# Patient Record
Sex: Male | Born: 1960 | Race: Black or African American | Hispanic: No | Marital: Married | Smoking: Former smoker
Health system: Southern US, Community
[De-identification: ages and names within clinical notes are randomized; demographics above are authoritative.]

## PROBLEM LIST (undated history)

## (undated) DIAGNOSIS — E7800 Pure hypercholesterolemia, unspecified: Secondary | ICD-10-CM

## (undated) DIAGNOSIS — R319 Hematuria, unspecified: Secondary | ICD-10-CM

## (undated) DIAGNOSIS — R7302 Impaired glucose tolerance (oral): Secondary | ICD-10-CM

## (undated) DIAGNOSIS — J329 Chronic sinusitis, unspecified: Secondary | ICD-10-CM

## (undated) DIAGNOSIS — I10 Essential (primary) hypertension: Secondary | ICD-10-CM

## (undated) DIAGNOSIS — F329 Major depressive disorder, single episode, unspecified: Secondary | ICD-10-CM

## (undated) HISTORY — DX: Hematuria, unspecified: R31.9

## (undated) HISTORY — DX: Impaired glucose tolerance (oral): R73.02

## (undated) HISTORY — DX: Major depressive disorder, single episode, unspecified: F32.9

## (undated) HISTORY — PX: OTHER SURGICAL HISTORY: SHX169

## (undated) HISTORY — DX: Chronic sinusitis, unspecified: J32.9

## (undated) HISTORY — DX: Essential (primary) hypertension: I10

## (undated) HISTORY — DX: Pure hypercholesterolemia, unspecified: E78.00

## (undated) HISTORY — PX: COLONOSCOPY: SHX174

---

## 2006-07-17 ENCOUNTER — Encounter: Admission: RE | Admit: 2006-07-17 | Discharge: 2006-07-17 | Payer: Self-pay | Admitting: Family Medicine

## 2010-08-10 ENCOUNTER — Other Ambulatory Visit: Payer: Self-pay | Admitting: Family Medicine

## 2010-08-10 ENCOUNTER — Ambulatory Visit
Admission: RE | Admit: 2010-08-10 | Discharge: 2010-08-10 | Disposition: A | Discharge status: Home / Self Care | Payer: 59 | Source: Ambulatory Visit | Attending: Family Medicine | Admitting: Family Medicine

## 2010-08-10 DIAGNOSIS — R1013 Epigastric pain: Secondary | ICD-10-CM

## 2010-08-10 DIAGNOSIS — D72829 Elevated white blood cell count, unspecified: Secondary | ICD-10-CM

## 2011-05-30 ENCOUNTER — Other Ambulatory Visit: Payer: Self-pay | Admitting: Physician Assistant

## 2011-05-30 DIAGNOSIS — Q619 Cystic kidney disease, unspecified: Secondary | ICD-10-CM

## 2011-06-07 ENCOUNTER — Ambulatory Visit
Admission: RE | Admit: 2011-06-07 | Discharge: 2011-06-07 | Disposition: A | Discharge status: Home / Self Care | Payer: 59 | Source: Ambulatory Visit | Attending: Physician Assistant | Admitting: Physician Assistant

## 2011-06-07 DIAGNOSIS — Q619 Cystic kidney disease, unspecified: Secondary | ICD-10-CM

## 2011-07-09 ENCOUNTER — Ambulatory Visit (INDEPENDENT_AMBULATORY_CARE_PROVIDER_SITE_OTHER): Payer: 59 | Admitting: Family Medicine

## 2011-07-09 VITALS — BP 135/81 | HR 90 | Temp 98.4°F | Resp 16 | Ht 65.5 in | Wt 185.8 lb

## 2011-07-09 DIAGNOSIS — E785 Hyperlipidemia, unspecified: Secondary | ICD-10-CM | POA: Insufficient documentation

## 2011-07-09 DIAGNOSIS — J309 Allergic rhinitis, unspecified: Secondary | ICD-10-CM

## 2011-07-09 DIAGNOSIS — I10 Essential (primary) hypertension: Secondary | ICD-10-CM

## 2011-07-09 DIAGNOSIS — J0100 Acute maxillary sinusitis, unspecified: Secondary | ICD-10-CM

## 2011-07-09 DIAGNOSIS — J329 Chronic sinusitis, unspecified: Secondary | ICD-10-CM

## 2011-07-09 MED ORDER — MOMETASONE FUROATE 50 MCG/ACT NA SUSP: 2.0000 | Freq: Every day | NASAL | Status: DC

## 2011-07-09 MED ORDER — AMOXICILLIN 875 MG PO TABS: 875.0000 mg | ORAL_TABLET | Freq: Two times a day (BID) | ORAL | Status: AC

## 2011-07-09 NOTE — Progress Notes (Signed)
  Subjective:    Patient ID: Isaac Mayer, male    DOB: 1961/03/14, 51 y.o.   MRN: [id number]  Sinusitis This is a new problem. The current episode started 1 to 4 weeks ago. The problem has been gradually worsening since onset. There has been no fever. Associated symptoms include congestion, a hoarse voice, sinus pressure and a sore throat. Past treatments include oral decongestants.   Using OTC tylenol sinus with short term relief of symptoms.  H/O recurrent sinusitis Allergic rhinitis HTN   Review of Systems  HENT: Positive for congestion, sore throat, hoarse voice and sinus pressure.        Objective:   Physical Exam  Constitutional: He appears well-developed.  HENT:  Right Ear: Tympanic membrane is retracted.  Nose: Mucosal edema (pale swollen turbinates (B)) present. Right sinus exhibits maxillary sinus tenderness.  Mouth/Throat: Posterior oropharyngeal erythema present.  Neck: Neck supple.  Cardiovascular: Normal rate, regular rhythm and normal heart sounds.   Pulmonary/Chest: Effort normal and breath sounds normal.  Lymphadenopathy:    He has cervical adenopathy.  Neurological: He is alert.  Skin: Skin is warm.          Assessment & Plan:   1. Sinusitis  amoxicillin (AMOXIL) 875 MG tablet  2. Allergic rhinitis due to allergen  mometasone (NASONEX) 50 MCG/ACT nasal spray  3. HTN (hypertension)    4. Dyslipidemia     Anticipatory guidance Call or RTC if symptoms persist or worsen

## 2013-12-02 ENCOUNTER — Emergency Department: Payer: Self-pay | Admitting: Emergency Medicine

## 2015-09-09 ENCOUNTER — Ambulatory Visit (INDEPENDENT_AMBULATORY_CARE_PROVIDER_SITE_OTHER): Payer: BLUE CROSS/BLUE SHIELD | Admitting: Physician Assistant

## 2015-09-09 VITALS — BP 138/78 | HR 96 | Temp 97.9°F | Resp 18 | Ht 66.0 in | Wt 198.8 lb

## 2015-09-09 DIAGNOSIS — J3089 Other allergic rhinitis: Secondary | ICD-10-CM | POA: Diagnosis not present

## 2015-09-09 DIAGNOSIS — R05 Cough: Secondary | ICD-10-CM | POA: Diagnosis not present

## 2015-09-09 DIAGNOSIS — R059 Cough, unspecified: Secondary | ICD-10-CM

## 2015-09-09 MED ORDER — BENZONATATE 100 MG PO CAPS: 100.0000 mg | ORAL_CAPSULE | Freq: Three times a day (TID) | ORAL | Status: DC | PRN

## 2015-09-09 MED ORDER — IPRATROPIUM BROMIDE 0.03 % NA SOLN: 2.0000 | Freq: Two times a day (BID) | NASAL | Status: DC

## 2015-09-09 MED ORDER — MOMETASONE FUROATE 50 MCG/ACT NA SUSP: 2.0000 | Freq: Every day | NASAL | Status: AC

## 2015-09-09 NOTE — Progress Notes (Signed)
Chief Complaint  Patient presents with  . Cough    chest congestion/ x 2wk/ chest hurts from coughing  . Sinusitis    x 3 days  . Sore Throat    scratchy/ x2days    History of Present Illness: Patient presents today with a 2 week history of sneezing, followed a few days later by congestion and nasal drainage, with a milky yellow discharge, and a dry hacking cough that began 4 days ago. He is accompanied by his wife, who "made" him come in for evaluation. They are scheduled to leave today for a trip to [place], [place].  Admits to difficulty sleeping since cough began, chest pain and shortness of breath associated only with coughing fits. Denies fever, sputum, nausea, vomiting, and diarrhea. Tried mucinex and nyquil to alievate symptoms with no relief.  Patient has history of seasonal allergies, usually with relief from over the counter allergy medications. He has Nasonex, but does not use it.  Review of Systems  Genitourinary: Negative for urgency, decreased urine volume and difficulty urinating.  Musculoskeletal: Negative for neck pain and neck stiffness.  Skin: Negative for color change.  Neurological: Negative for dizziness and headaches.   All other systems reviewed are negative except those listed in HPI.   No Known Allergies  Prior to Admission medications   Medication Sig Start Date End Date Taking? Authorizing Provider  amLODipine (NORVASC) 10 MG tablet Take 10 mg by mouth daily.   Yes Historical Provider, MD  atorvastatin (LIPITOR) 40 MG tablet Take 40 mg by mouth daily.   Yes Historical Provider, MD  FLUoxetine (PROZAC) 20 MG capsule Take 20 mg by mouth daily.   Yes Historical Provider, MD  mometasone (NASONEX) 50 MCG/ACT nasal spray Place 2 sprays into the nose daily. 07/09/11 07/08/12  Dois Davenport, MD    Patient Active Problem List   Diagnosis Date Noted  . HTN (hypertension) 07/09/2011  . Dyslipidemia 07/09/2011     Physical Exam  Constitutional: He is  oriented to person, place, and time. He appears well-developed and well-nourished. No distress.  BP 138/78 mmHg  Pulse 96  Temp(Src) 97.9 F (36.6 C) (Oral)  Resp 18  Ht  (1.676 m)  Wt 198 lb 12.8 oz (90.175 kg)  BMI 32.10 kg/m2  SpO2 98%   HENT:  Head: Normocephalic and atraumatic.  Right Ear: Hearing, tympanic membrane, external ear and ear canal normal.  Left Ear: Hearing, tympanic membrane, external ear and ear canal normal.  Nose: Mucosal edema and rhinorrhea present.  No foreign bodies. Right sinus exhibits no maxillary sinus tenderness and no frontal sinus tenderness. Left sinus exhibits no maxillary sinus tenderness and no frontal sinus tenderness.  Mouth/Throat: Uvula is midline, oropharynx is clear and moist and mucous membranes are normal. No uvula swelling. No oropharyngeal exudate.  Eyes: Conjunctivae and EOM are normal. Pupils are equal, round, and reactive to light. Right eye exhibits no discharge. Left eye exhibits no discharge. No scleral icterus.  Neck: Trachea normal, normal range of motion and full passive range of motion without pain. Neck supple. No thyroid mass and no thyromegaly present.  Cardiovascular: Normal rate, regular rhythm and normal heart sounds.   Pulmonary/Chest: Effort normal and breath sounds normal.  Lymphadenopathy:       Head (right side): No submandibular, no tonsillar, no preauricular, no posterior auricular and no occipital adenopathy present.       Head (left side): No submandibular, no tonsillar, no preauricular and no occipital adenopathy present.  He has no cervical adenopathy.       Right: No supraclavicular adenopathy present.       Left: No supraclavicular adenopathy present.  Neurological: He is alert and oriented to person, place, and time. He has normal strength. No cranial nerve deficit or sensory deficit.  Skin: Skin is warm, dry and intact. No rash noted.  Psychiatric: He has a normal mood and affect. His speech is normal  and behavior is normal.      ASSESSMENT & PLAN:  1. Cough Due to post-nasal drainage. See below. Supportive care. - benzonatate (TESSALON) 100 MG capsule; Take 1-2 capsules (100-200 mg total) by mouth 3 (three) times daily as needed for cough.  Dispense: 40 capsule; Refill: 0  2. Other allergic rhinitis Restart Nasonex. Add OTC oral antihistamine. Add Atrovent NS. Hydrate.  - ipratropium (ATROVENT) 0.03 % nasal spray; Place 2 sprays into both nostrils 2 (two) times daily.  Dispense: 30 mL; Refill: 0 - mometasone (NASONEX) 50 MCG/ACT nasal spray; Place 2 sprays into the nose daily.  Dispense: 1 g; Refill: 12   Fernande Brashelle S. Sameena Artus, PA-C Physician Assistant-Certified [facility]

## 2015-09-09 NOTE — Patient Instructions (Signed)
Get plenty of rest and drink at least 64 ounces of water daily.     IF you received an x-ray today, you will receive an invoice from [facility]. Please contact [facility] at [phone] with questions or concerns regarding your invoice.   IF you received labwork today, you will receive an invoice from [facility]. Please contact [facility] at [phone] with questions or concerns regarding your invoice.   Our billing staff will not be able to assist you with questions regarding bills from these companies.  You will be contacted with the lab results as soon as they are available. The fastest way to get your results is to activate your My Chart account. Instructions are located on the last page of this paperwork. If you have not heard from us regarding the results in 2 weeks, please contact this office.     

## 2015-09-09 NOTE — Progress Notes (Signed)
   Subjective:    Patient ID: Isaac Mayer, male    DOB: 1960-12-13, 55 y.o.   MRN: [id number]  Chief Complaint  Patient presents with  . Cough    chest congestion/ x 2wk/ chest hurts from coughing  . Sinusitis    x 3 days  . Sore Throat    scratchy/ x2days   HPI  Patient presents today with a 2 week history of sneezing, followed a few days later by congestion and nasal drainage, with a milky yellow discharge, and a dry hacking cough that began 4 days ago.  Admits to difficulty sleeping since cough began, chest pain and shortness of breath associated only with coughing fits.  Denies fever, sputum, nausea, vomiting, and diarrhea.  Tried mucinex and nyquil to alievate symptoms with no relief. Patient has history of seasonal allergies, usually with relief from over the counter allergy medications.  Review of Systems  Genitourinary: Negative for urgency, decreased urine volume and difficulty urinating.  Musculoskeletal: Negative for neck pain and neck stiffness.  Skin: Negative for color change.  Neurological: Negative for dizziness and headaches.   All other systems reviewed are negative except those listed in HPI.    Patient Active Problem List   Diagnosis Date Noted  . HTN (hypertension) 07/09/2011  . Dyslipidemia 07/09/2011   Current Outpatient Prescriptions on File Prior to Visit  Medication Sig Dispense Refill  . amLODipine (NORVASC) 10 MG tablet Take 10 mg by mouth daily.    Marland Kitchen. atorvastatin (LIPITOR) 40 MG tablet Take 40 mg by mouth daily.     No current facility-administered medications on file prior to visit.   No Known Allergies  Objective: BP 138/78 mmHg  Pulse 96  Temp(Src) 97.9 F (36.6 C) (Oral)  Resp 18  Ht 5\' 6"  (1.676 m)  Wt 198 lb 12.8 oz (90.175 kg)  BMI 32.10 kg/m2  SpO2 98%   Physical Exam  Constitutional: He appears well-developed and well-nourished.  HENT:  Head: Normocephalic.  Nose: Rhinorrhea present. Right sinus exhibits no maxillary sinus  tenderness and no frontal sinus tenderness. Left sinus exhibits no maxillary sinus tenderness and no frontal sinus tenderness.  Mouth/Throat: Posterior oropharyngeal erythema present.    Cardiovascular: Normal rate, regular rhythm, normal heart sounds and intact distal pulses.   Pulmonary/Chest: Effort normal and breath sounds normal. No accessory muscle usage. No respiratory distress. He has no wheezes. He has no rhonchi. He has no rales.  Skin: Skin is warm, dry and intact.       Assessment & Plan:  1. Cough - Discussed management including allergy control with over the counter antihistamines and nasal  - benzonatate (TESSALON) 100 MG capsule; Take 1-2 capsules (100-200 mg total) by mouth 3 (three) times daily as needed for cough.  Dispense: 40 capsule; Refill: 0  2. Other allergic rhinitis -Use of OTC antihistamines and combination of prescribed nasal spray should alleviate allergy symptoms.   - ipratropium (ATROVENT) 0.03 % nasal spray; Place 2 sprays into both nostrils 2 (two) times daily.  Dispense: 30 mL; Refill: 0 - mometasone (NASONEX) 50 MCG/ACT nasal spray; Place 2 sprays into the nose daily.  Dispense: 1 g; Refill: 12

## 2016-03-24 ENCOUNTER — Ambulatory Visit (INDEPENDENT_AMBULATORY_CARE_PROVIDER_SITE_OTHER): Payer: BLUE CROSS/BLUE SHIELD | Admitting: Neurology

## 2016-03-24 ENCOUNTER — Encounter: Payer: Self-pay | Admitting: Neurology

## 2016-03-24 VITALS — BP 132/72 | HR 77 | Resp 20 | Ht 66.0 in | Wt 200.0 lb

## 2016-03-24 DIAGNOSIS — R400 Somnolence: Secondary | ICD-10-CM

## 2016-03-24 DIAGNOSIS — R0681 Apnea, not elsewhere classified: Secondary | ICD-10-CM | POA: Diagnosis not present

## 2016-03-24 DIAGNOSIS — E669 Obesity, unspecified: Secondary | ICD-10-CM | POA: Diagnosis not present

## 2016-03-24 DIAGNOSIS — F418 Other specified anxiety disorders: Secondary | ICD-10-CM | POA: Diagnosis not present

## 2016-03-24 DIAGNOSIS — F518 Other sleep disorders not due to a substance or known physiological condition: Secondary | ICD-10-CM

## 2016-03-24 DIAGNOSIS — G4726 Circadian rhythm sleep disorder, shift work type: Secondary | ICD-10-CM

## 2016-03-24 DIAGNOSIS — R0683 Snoring: Secondary | ICD-10-CM

## 2016-03-24 NOTE — Patient Instructions (Signed)

## 2016-03-24 NOTE — Progress Notes (Signed)
Subjective:    Patient ID: Isaac Mayer is a 55 y.o. male.  HPI     Huston FoleySaima Deetta Siegmann, MD, PhD [facility] [address], [address] [address] [place], [place] [address]  HPI:   Dear Dr. Nicholos Johnsamachandran,   I saw your patient, Isaac Mayer, upon your kind request in my neurologic clinic today for initial consultation of his sleep disorder, in particular, concern for underlying obstructive sleep apnea. The patient is unaccompanied today. As you know, Mr. Isaac Mayer is a 55 year old right-handed gentleman with an underlying medical history of hyperlipidemia, hypertension, depression, impaired glucose tolerance, and obesity, who reports snoring and excessive daytime somnolence. I reviewed your office note from 02/15/2016, which you kindly included.  His main complaint is multiple night time awakenings, difficulty maintaining sleep for years. He snores, he has had pauses in his breathing, he has occasional morning headaches and has nocturia once per night, no clear RLS, but has jerking movements in sleep, waking him up or noticed by wife, no TV on at night.  He is a light sleeper, has knee pain, R > L.  In the past, he had a sleep study, but results are not available for my review. He was recently started on Doxepin 3 mg, but takes it only a couple of times a week, and breaks of a piece, felt drowsy on the whole pill. Had groogy feeling in the past with ambien IR, Lunesta, sonate and trazodone, Melatonin did not help, PM meds caused him to be not refreshed.  Had blood work, including TFTs, all benign.  No FHx of OSA. Has been on Vit D. Of note, for the past 3 years he has been working shifts. He has 12 hour shifts. He works from 5 AM to 5 PM. He works 3 days on and one day off, then 3 days on and 7 days off. On his work nights, he goes to bed between 7 and 8 and has to get up at 3:30 AM, on other nights he tries to go to bed at 10 PM. He lives with his wife, wife snores. He has 3 grown  sons, 3 grandchildren. He works as a Counsellorprinter. He quit smoking in the 90s, drinks alcohol occasionally, sometimes on weekends. He drinks 1-2 cups of coffee per day, occasional sodas. Epworth Sleepiness Scale score is 4 out of 24 today, fatigue score is 51 out of 63. He typically does not wake up rested and is sleepy during the day but usually does not fall asleep as long as he is moving.   His Past Medical History Is Significant For: Past Medical History:  Diagnosis Date  . Hematuria   . Hypercholesteremia   . Hypertension   . Impaired glucose tolerance   . MDD (major depressive disorder)   . Sinusitis     His Past Surgical History Is Significant For: Past Surgical History:  Procedure Laterality Date  . thumb surgery Left     His Family History Is Significant For: Family History  Problem Relation Age of Onset  . Diabetes Mother   . Hyperlipidemia Father   . Hypertension Father     His Social History Is Significant For: Social History   Social History  . Marital status: Married    Spouse name: N/A  . Number of children: N/A  . Years of education: N/A   Social History Main Topics  . Smoking status: Former Games developermoker  . Smokeless tobacco: None  . Alcohol use None  . Drug  use: Unknown  . Sexual activity: Not Asked   Other Topics Concern  . None   Social History Narrative  . None    His Allergies Are:  No Known Allergies:   His Current Medications Are:  Outpatient Encounter Prescriptions as of 03/24/2016  Medication Sig  . amLODipine (NORVASC) 10 MG tablet Take 10 mg by mouth daily.  Marland Kitchen atorvastatin (LIPITOR) 40 MG tablet Take 40 mg by mouth daily.  . Doxepin HCl (SILENOR) 3 MG TABS Take 3 mg by mouth at bedtime as needed.   Marland Kitchen FLUoxetine (PROZAC) 40 MG capsule TK ONE C PO  DAILY  . fluticasone (FLONASE) 50 MCG/ACT nasal spray Place into both nostrils daily.  Marland Kitchen ipratropium (ATROVENT) 0.03 % nasal spray Place 2 sprays into both nostrils 2 (two) times daily.  Marland Kitchen  lisinopril (PRINIVIL,ZESTRIL) 10 MG tablet TK 1 T PO QD  . mometasone (NASONEX) 50 MCG/ACT nasal spray Place 2 sprays into the nose daily.  . [DISCONTINUED] benzonatate (TESSALON) 100 MG capsule Take 1-2 capsules (100-200 mg total) by mouth 3 (three) times daily as needed for cough.   No facility-administered encounter medications on file as of 03/24/2016.   :  Review of Systems:  Out of a complete 14 point review of systems, all are reviewed and negative with the exception of these symptoms as listed below: Review of Systems  Neurological: Positive for headaches.       Pt presents today to discuss his sleep. Pt says that he does not snore at night. Pt had a sleep study many years ago that showed he did sleep. Pt takes doxepin 3mg  if needed for sleep.  Epworth Sleepiness Scale 0= would never doze 1= slight chance of dozing 2= moderate chance of dozing 3= high chance of dozing  Sitting and reading: 0 Watching TV: 1 Sitting inactive in a public place (ex. Theater or meeting): 0 As a passenger in a car for an hour without a break: 1 Lying down to rest in the afternoon: 1 Sitting and talking to someone: 0 Sitting quietly after lunch (no alcohol): 1 In a car, while stopped in traffic: 0 Total: 4   Psychiatric/Behavioral: Positive for sleep disturbance.    Objective:  Neurologic Exam  Physical Exam Physical Examination:   Vitals:   03/24/16 0900  BP: 132/72  Pulse: 77  Resp: 20    General Examination: The patient is a very pleasant 55 y.o. male in no acute distress. He appears well-developed and well-nourished and well groomed.   HEENT: Normocephalic, atraumatic, pupils are equal, round and reactive to light and accommodation. Funduscopic exam is normal with sharp disc margins noted. Extraocular tracking is good without limitation to gaze excursion or nystagmus noted. Normal smooth pursuit is noted. Hearing is grossly intact. Tympanic membranes are clear bilaterally. Face is  symmetric with normal facial animation and normal facial sensation. Speech is clear with no dysarthria noted. There is no hypophonia. There is no lip, neck/head, jaw or voice tremor. Neck is supple with full range of passive and active motion. There are no carotid bruits on auscultation. Oropharynx exam reveals: mild mouth dryness, adequate dental hygiene with few missing teeth and moderate airway crowding, due to smaller airway entry, thicker soft palate, and larger appearing uvula. Mallampati is class III. Tongue protrudes centrally and palate elevates symmetrically. Mouth opening small. Tonsils are absent. Neck size is 17 5/8 inches. He has a Mild overbite. Nasal inspection reveals no significant nasal mucosal bogginess or redness and no septal  deviation.   Chest: Clear to auscultation without wheezing, rhonchi or crackles noted.  Heart: S1+S2+0, regular and normal without murmurs, rubs or gallops noted.   Abdomen: Soft, non-tender and non-distended with normal bowel sounds appreciated on auscultation.  Extremities: There is no pitting edema in the distal lower extremities bilaterally. Pedal pulses are intact.  Skin: Warm and dry without trophic changes noted. There are no varicose veins.  Musculoskeletal: exam reveals no obvious joint deformities, tenderness or joint swelling or erythema.   Neurologically:  Mental status: The patient is awake, alert and oriented in all 4 spheres. His immediate and remote memory, attention, language skills and fund of knowledge are appropriate. There is no evidence of aphasia, agnosia, apraxia or anomia. Speech is clear with normal prosody and enunciation. Thought process is linear. Mood is normal and affect is normal.  Cranial nerves II - XII are as described above under HEENT exam. In addition: shoulder shrug is normal with equal shoulder height noted. Motor exam: Normal bulk, strength and tone is noted. There is no drift, tremor or rebound. Romberg is  negative. Reflexes are 2+ throughout. Fine motor skills and coordination: intact with normal finger taps, normal hand movements, normal rapid alternating patting, normal foot taps and normal foot agility.  Cerebellar testing: No dysmetria or intention tremor on finger to nose testing. Heel to shin is unremarkable bilaterally. There is no truncal or gait ataxia.  Sensory exam: intact to light touch, pinprick, vibration, temperature sense in the upper and lower extremities.  Gait, station and balance: He stands easily. No veering to one side is noted. No leaning to one side is noted. Posture is age-appropriate and stance is narrow based. Gait shows normal stride length and normal pace. No problems turning are noted. Tandem walk is unremarkable.  Assessment and Plan:  In summary, Isaac Mayer is a very pleasant 55 y.o.-year old male with an underlying medical history of hyperlipidemia, hypertension, depression, impaired glucose tolerance, and obesity, whose history and physical exam are concerning for obstructive sleep apnea (OSA). He has a borderline enlarged next size, crowded airway, and has obesity. In addition, he has had morning headaches. Furthermore, he reports sleep related movements including jerking movements, likely in keeping with sleep starts. He has been on antidepressant medication for anxiety and depression, while the medication has been working for him, he has had more anxiety when he does not sleep well. In addition, his shift work is likely a contributor to his sleep disturbance at this time as well. I had a long chat with the patient about my findings and the diagnosis of OSA, its prognosis and treatment options. We talked about medical treatments, surgical interventions and non-pharmacological approaches. I explained in particular the risks and ramifications of untreated moderate to severe OSA, especially with respect to developing cardiovascular disease down the Road, including  congestive heart failure, difficult to treat hypertension, cardiac arrhythmias, or stroke. Even type 2 diabetes has, in part, been linked to untreated OSA. Symptoms of untreated OSA include daytime sleepiness, memory problems, mood irritability and mood disorder such as depression and anxiety, lack of energy, as well as recurrent headaches, especially morning headaches. We talked about trying to maintain a healthy lifestyle in general, as well as the importance of weight control. I encouraged the patient to eat healthy, exercise daily and keep well hydrated, to keep a scheduled bedtime and wake time routine, to not skip any meals and eat healthy snacks in between meals. I advised the patient not to  drive when feeling sleepy. He is advised that he can bring his doxepin for his sleep study. I recommended the following at this time: sleep study with potential positive airway pressure titration. (We will score hypopneas at 3% and split the sleep study into diagnostic and treatment portion, if the estimated. 2 hour AHI is >15/h).   I explained the sleep test procedure to the patient and also outlined possible surgical and non-surgical treatment options of OSA, including the use of a custom-made dental device (which would require a referral to a specialist dentist or oral surgeon), upper airway surgical options, such as pillar implants, radiofrequency surgery, tongue base surgery, and UPPP (which would involve a referral to an ENT surgeon). Rarely, jaw surgery such as mandibular advancement may be considered.  I also explained the CPAP treatment option to the patient, who indicated that he would be willing to try CPAP if the need arises. I explained the importance of being compliant with PAP treatment, not only for insurance purposes but primarily to improve His symptoms, and for the patient's long term health benefit, including to reduce His cardiovascular risks. I answered all his questions today and the patient  was in agreement. I would like to see him back after the sleep study is completed and encouraged him to call with any interim questions, concerns, problems or updates.   Thank you very much for allowing me to participate in the care of this nice patient. If I can be of any further assistance to you please do not hesitate to call me at [phone].  Sincerely,   Huston FoleySaima Lastacia Solum, MD, PhD

## 2017-04-10 ENCOUNTER — Other Ambulatory Visit: Payer: Self-pay | Admitting: Internal Medicine

## 2017-04-10 DIAGNOSIS — R319 Hematuria, unspecified: Secondary | ICD-10-CM

## 2017-04-12 ENCOUNTER — Ambulatory Visit
Admission: RE | Admit: 2017-04-12 | Discharge: 2017-04-12 | Disposition: A | Discharge status: Home / Self Care | Payer: BLUE CROSS/BLUE SHIELD | Source: Ambulatory Visit | Attending: Internal Medicine | Admitting: Internal Medicine

## 2017-04-12 DIAGNOSIS — R319 Hematuria, unspecified: Secondary | ICD-10-CM

## 2017-08-07 ENCOUNTER — Telehealth: Payer: Self-pay

## 2017-08-07 ENCOUNTER — Ambulatory Visit: Payer: Self-pay | Admitting: Neurology

## 2017-08-07 NOTE — Telephone Encounter (Signed)
Pt did not show for their appt with Dr. Athar today.  

## 2017-08-08 ENCOUNTER — Encounter: Payer: Self-pay | Admitting: Neurology

## 2017-10-05 DIAGNOSIS — E785 Hyperlipidemia, unspecified: Secondary | ICD-10-CM | POA: Diagnosis not present

## 2017-10-05 DIAGNOSIS — R319 Hematuria, unspecified: Secondary | ICD-10-CM | POA: Diagnosis not present

## 2017-10-05 DIAGNOSIS — I10 Essential (primary) hypertension: Secondary | ICD-10-CM | POA: Diagnosis not present

## 2017-10-11 DIAGNOSIS — R7303 Prediabetes: Secondary | ICD-10-CM | POA: Diagnosis not present

## 2017-10-11 DIAGNOSIS — R319 Hematuria, unspecified: Secondary | ICD-10-CM | POA: Diagnosis not present

## 2017-10-11 DIAGNOSIS — I10 Essential (primary) hypertension: Secondary | ICD-10-CM | POA: Diagnosis not present

## 2017-10-11 DIAGNOSIS — E785 Hyperlipidemia, unspecified: Secondary | ICD-10-CM | POA: Diagnosis not present

## 2017-11-06 DIAGNOSIS — H1045 Other chronic allergic conjunctivitis: Secondary | ICD-10-CM | POA: Diagnosis not present

## 2018-01-19 DIAGNOSIS — S76012A Strain of muscle, fascia and tendon of left hip, initial encounter: Secondary | ICD-10-CM | POA: Diagnosis not present

## 2018-04-05 DIAGNOSIS — G5602 Carpal tunnel syndrome, left upper limb: Secondary | ICD-10-CM | POA: Diagnosis not present

## 2018-04-19 DIAGNOSIS — M5412 Radiculopathy, cervical region: Secondary | ICD-10-CM | POA: Diagnosis not present

## 2018-04-19 DIAGNOSIS — R202 Paresthesia of skin: Secondary | ICD-10-CM | POA: Diagnosis not present

## 2018-04-20 DIAGNOSIS — Z125 Encounter for screening for malignant neoplasm of prostate: Secondary | ICD-10-CM | POA: Diagnosis not present

## 2018-04-20 DIAGNOSIS — I10 Essential (primary) hypertension: Secondary | ICD-10-CM | POA: Diagnosis not present

## 2018-04-20 DIAGNOSIS — E785 Hyperlipidemia, unspecified: Secondary | ICD-10-CM | POA: Diagnosis not present

## 2018-04-20 DIAGNOSIS — R7303 Prediabetes: Secondary | ICD-10-CM | POA: Diagnosis not present

## 2018-04-24 DIAGNOSIS — M5412 Radiculopathy, cervical region: Secondary | ICD-10-CM | POA: Diagnosis not present

## 2018-04-25 DIAGNOSIS — Z0000 Encounter for general adult medical examination without abnormal findings: Secondary | ICD-10-CM | POA: Diagnosis not present

## 2018-04-25 DIAGNOSIS — R7303 Prediabetes: Secondary | ICD-10-CM | POA: Diagnosis not present

## 2018-04-25 DIAGNOSIS — I10 Essential (primary) hypertension: Secondary | ICD-10-CM | POA: Diagnosis not present

## 2018-04-25 DIAGNOSIS — E785 Hyperlipidemia, unspecified: Secondary | ICD-10-CM | POA: Diagnosis not present

## 2018-05-03 DIAGNOSIS — M5412 Radiculopathy, cervical region: Secondary | ICD-10-CM | POA: Diagnosis not present

## 2018-08-22 DIAGNOSIS — M25562 Pain in left knee: Secondary | ICD-10-CM | POA: Diagnosis not present

## 2018-08-22 DIAGNOSIS — M25462 Effusion, left knee: Secondary | ICD-10-CM | POA: Diagnosis not present

## 2018-09-06 ENCOUNTER — Other Ambulatory Visit: Payer: Self-pay | Admitting: Sports Medicine

## 2018-09-06 DIAGNOSIS — M25462 Effusion, left knee: Secondary | ICD-10-CM | POA: Diagnosis not present

## 2018-09-06 DIAGNOSIS — M2392 Unspecified internal derangement of left knee: Secondary | ICD-10-CM | POA: Diagnosis not present

## 2018-09-06 DIAGNOSIS — G8929 Other chronic pain: Secondary | ICD-10-CM

## 2018-09-06 DIAGNOSIS — M76892 Other specified enthesopathies of left lower limb, excluding foot: Secondary | ICD-10-CM | POA: Diagnosis not present

## 2018-09-06 DIAGNOSIS — M25562 Pain in left knee: Secondary | ICD-10-CM | POA: Diagnosis not present

## 2018-09-16 ENCOUNTER — Ambulatory Visit: Payer: Self-pay

## 2018-09-24 ENCOUNTER — Ambulatory Visit
Admission: RE | Admit: 2018-09-24 | Discharge: 2018-09-24 | Disposition: A | Discharge status: Home / Self Care | Payer: BC Managed Care – PPO | Source: Ambulatory Visit | Attending: Sports Medicine | Admitting: Sports Medicine

## 2018-09-24 ENCOUNTER — Other Ambulatory Visit: Payer: Self-pay

## 2018-09-24 DIAGNOSIS — M25562 Pain in left knee: Secondary | ICD-10-CM | POA: Insufficient documentation

## 2018-09-24 DIAGNOSIS — M76892 Other specified enthesopathies of left lower limb, excluding foot: Secondary | ICD-10-CM | POA: Insufficient documentation

## 2018-09-24 DIAGNOSIS — G8929 Other chronic pain: Secondary | ICD-10-CM | POA: Insufficient documentation

## 2018-09-24 DIAGNOSIS — M65872 Other synovitis and tenosynovitis, left ankle and foot: Secondary | ICD-10-CM | POA: Diagnosis not present

## 2018-09-24 DIAGNOSIS — M2392 Unspecified internal derangement of left knee: Secondary | ICD-10-CM | POA: Diagnosis not present

## 2018-09-24 DIAGNOSIS — M25462 Effusion, left knee: Secondary | ICD-10-CM

## 2018-09-24 DIAGNOSIS — M7122 Synovial cyst of popliteal space [Baker], left knee: Secondary | ICD-10-CM | POA: Diagnosis not present

## 2018-10-04 DIAGNOSIS — M659 Synovitis and tenosynovitis, unspecified: Secondary | ICD-10-CM | POA: Diagnosis not present

## 2018-10-04 DIAGNOSIS — M25562 Pain in left knee: Secondary | ICD-10-CM | POA: Diagnosis not present

## 2018-10-04 DIAGNOSIS — M25462 Effusion, left knee: Secondary | ICD-10-CM | POA: Diagnosis not present

## 2018-10-04 DIAGNOSIS — M1712 Unilateral primary osteoarthritis, left knee: Secondary | ICD-10-CM | POA: Diagnosis not present

## 2018-10-22 DIAGNOSIS — H40013 Open angle with borderline findings, low risk, bilateral: Secondary | ICD-10-CM | POA: Diagnosis not present

## 2018-11-15 DIAGNOSIS — H40013 Open angle with borderline findings, low risk, bilateral: Secondary | ICD-10-CM | POA: Diagnosis not present

## 2018-11-30 DIAGNOSIS — I10 Essential (primary) hypertension: Secondary | ICD-10-CM | POA: Diagnosis not present

## 2018-11-30 DIAGNOSIS — R7303 Prediabetes: Secondary | ICD-10-CM | POA: Diagnosis not present

## 2018-11-30 DIAGNOSIS — E785 Hyperlipidemia, unspecified: Secondary | ICD-10-CM | POA: Diagnosis not present

## 2018-12-13 DIAGNOSIS — Z23 Encounter for immunization: Secondary | ICD-10-CM | POA: Diagnosis not present

## 2018-12-13 DIAGNOSIS — F320 Major depressive disorder, single episode, mild: Secondary | ICD-10-CM | POA: Diagnosis not present

## 2018-12-13 DIAGNOSIS — E785 Hyperlipidemia, unspecified: Secondary | ICD-10-CM | POA: Diagnosis not present

## 2018-12-13 DIAGNOSIS — F5101 Primary insomnia: Secondary | ICD-10-CM | POA: Diagnosis not present

## 2018-12-13 DIAGNOSIS — I10 Essential (primary) hypertension: Secondary | ICD-10-CM | POA: Diagnosis not present

## 2019-01-17 ENCOUNTER — Other Ambulatory Visit: Payer: Self-pay

## 2019-01-17 ENCOUNTER — Encounter: Payer: Self-pay | Admitting: Neurology

## 2019-01-17 ENCOUNTER — Ambulatory Visit: Payer: BC Managed Care – PPO | Admitting: Neurology

## 2019-01-17 VITALS — BP 151/85 | HR 106 | Temp 97.6°F | Ht 66.0 in | Wt 200.0 lb

## 2019-01-17 DIAGNOSIS — G478 Other sleep disorders: Secondary | ICD-10-CM | POA: Diagnosis not present

## 2019-01-17 DIAGNOSIS — R0683 Snoring: Secondary | ICD-10-CM

## 2019-01-17 DIAGNOSIS — G4719 Other hypersomnia: Secondary | ICD-10-CM | POA: Diagnosis not present

## 2019-01-17 DIAGNOSIS — E669 Obesity, unspecified: Secondary | ICD-10-CM

## 2019-01-17 DIAGNOSIS — R351 Nocturia: Secondary | ICD-10-CM

## 2019-01-17 DIAGNOSIS — R519 Headache, unspecified: Secondary | ICD-10-CM

## 2019-01-17 NOTE — Patient Instructions (Signed)

## 2019-01-17 NOTE — Progress Notes (Signed)
Subjective:    Patient ID: Isaac Mayer is a 58 y.o. male.  HPI     Interim history:   Isaac Mayer is a 57 year old right-handed gentleman with an underlying medical history of hyperlipidemia, seasonal allergies, hematuria, hypertension, depression, impaired glucose tolerance, and obesity, who presents for re-evaluation of his obstructive sleep apnea concern. The patient is unaccompanied today.  Of note, he no showed for an appointment on 08/07/2017. I first met him on 03/24/2016 at the request of his primary care physician, at which time the patient reported snoring and daytime somnolence. I suggested we proceed with a sleep study. He did not pursue it at the time.  Today 01/17/2019: He reports that he does not wake up rested.  He is tired during the day.  He works 1 week on and one-week off, typically from 5 AM to 5 PM 6 days a week when he is on his week on.  Bedtime is around 730 or 8, rise time around 3:30 AM.  He has nocturia about once per average night, has woken up with occasional morning headaches.  He does dream, he is a restless sleeper however and has multiple nighttime awakenings.  His wife has commented that he sounds like he has labored breathing or heavy breathing.  He has woken himself up with his snoring.  He had a tonsillectomy as a child.  He spends his days off with his 23-year-old grandson, his oldest son's son.  He has 3 grown sons.  His weight has been fairly stable.  He does drink quite a bit of caffeine, at least 2 maybe 3 large cups per day on average.  The patient's allergies, current medications, family history, past medical history, past social history, past surgical history and problem list were reviewed and updated as appropriate.    Previously:   03/24/2016: 58 year old right-handed gentleman with an underlying medical history of hyperlipidemia, hypertension, depression, impaired glucose tolerance, and obesity, who reports snoring and excessive daytime  somnolence. I reviewed your office note from 02/15/2016, which you kindly included.  His main complaint is multiple night time awakenings, difficulty maintaining sleep for years. He snores, he has had pauses in his breathing, he has occasional morning headaches and has nocturia once per night, no clear RLS, but has jerking movements in sleep, waking him up or noticed by wife, no TV on at night.  He is a light sleeper, has knee pain, R > L.  In the past, he had a sleep study, but results are not available for my review. He was recently started on Doxepin 3 mg, but takes it only a couple of times a week, and breaks of a piece, felt drowsy on the whole pill. Had groogy feeling in the past with ambien IR, Lunesta, sonate and trazodone, Melatonin did not help, PM meds caused him to be not refreshed.  Had blood work, including TFTs, all benign.  No FHx of OSA. Has been on Vit D. Of note, for the past 3 years he has been working shifts. He has 12 hour shifts. He works from 5 AM to 5 PM. He works 3 days on and one day off, then 3 days on and 7 days off. On his work nights, he goes to bed between 7 and 8 and has to get up at 3:30 AM, on other nights he tries to go to bed at 10 PM. He lives with his wife, wife snores. He has 3 grown sons, 3 grandchildren. He works as a  printer. He quit smoking in the 90s, drinks alcohol occasionally, sometimes on weekends. He drinks 1-2 cups of coffee per day, occasional sodas. Epworth Sleepiness Scale score is 4 out of 24 today, fatigue score is 51 out of 63. He typically does not wake up rested and is sleepy during the day but usually does not fall asleep as long as he is moving.   His Past Medical History Is Significant For: Past Medical History:  Diagnosis Date  . Hematuria   . Hypercholesteremia   . Hypertension   . Impaired glucose tolerance   . MDD (major depressive disorder)   . Sinusitis     His Past Surgical History Is Significant For: Past Surgical History:   Procedure Laterality Date  . thumb surgery Left     His Family History Is Significant For: Family History  Problem Relation Age of Onset  . Diabetes Mother   . Hyperlipidemia Father   . Hypertension Father     His Social History Is Significant For: Social History   Socioeconomic History  . Marital status: Married    Spouse name: Not on file  . Number of children: Not on file  . Years of education: Not on file  . Highest education level: Not on file  Occupational History  . Not on file  Social Needs  . Financial resource strain: Not on file  . Food insecurity    Worry: Not on file    Inability: Not on file  . Transportation needs    Medical: Not on file    Non-medical: Not on file  Tobacco Use  . Smoking status: Former Research scientist (life sciences)  . Smokeless tobacco: Never Used  Substance and Sexual Activity  . Alcohol use: Not on file  . Drug use: Not on file  . Sexual activity: Not on file  Lifestyle  . Physical activity    Days per week: Not on file    Minutes per session: Not on file  . Stress: Not on file  Relationships  . Social Herbalist on phone: Not on file    Gets together: Not on file    Attends religious service: Not on file    Active member of club or organization: Not on file    Attends meetings of clubs or organizations: Not on file    Relationship status: Not on file  Other Topics Concern  . Not on file  Social History Narrative  . Not on file    His Allergies Are:  No Known Allergies:   His Current Medications Are:  Outpatient Encounter Medications as of 01/17/2019  Medication Sig  . amLODipine (NORVASC) 10 MG tablet Take 10 mg by mouth daily.  Marland Kitchen atorvastatin (LIPITOR) 40 MG tablet Take 40 mg by mouth daily.  Marland Kitchen FLUoxetine (PROZAC) 40 MG capsule TK ONE C PO  DAILY  . lisinopril (PRINIVIL,ZESTRIL) 10 MG tablet TK 1 T PO QD  . meloxicam (MOBIC) 15 MG tablet Take 15 mg by mouth daily.  . [EXPIRED] mometasone (NASONEX) 50 MCG/ACT nasal spray  Place 2 sprays into the nose daily.  . [DISCONTINUED] Doxepin HCl (SILENOR) 3 MG TABS Take 3 mg by mouth at bedtime as needed.   . [DISCONTINUED] fluticasone (FLONASE) 50 MCG/ACT nasal spray Place into both nostrils daily.  . [DISCONTINUED] ipratropium (ATROVENT) 0.03 % nasal spray Place 2 sprays into both nostrils 2 (two) times daily.   No facility-administered encounter medications on file as of 01/17/2019.   :  Review  of Systems:  Out of a complete 14 point review of systems, all are reviewed and negative with the exception of these symptoms as listed below: Review of Systems  Neurological:       Pt presents today to discuss his sleep. Pt has had sleep studies in the past, the last one was around 2005, but never started on a cpap. Pt does endorse snoring.  Epworth Sleepiness Scale 0= would never doze 1= slight chance of dozing 2= moderate chance of dozing 3= high chance of dozing  Sitting and reading: 1 Watching TV: 2 Sitting inactive in a public place (ex. Theater or meeting): 0 As a passenger in a car for an hour without a break: 2 Lying down to rest in the afternoon: 1 Sitting and talking to someone: 0 Sitting quietly after lunch (no alcohol): 2 In a car, while stopped in traffic: 0 Total: 8     Objective:  Neurological Exam  Physical Exam Physical Examination:   Vitals:   01/17/19 0835  BP: (!) 151/85  Pulse: (!) 106  Temp: 97.6 F (36.4 C)   General Examination: The patient is a very pleasant 58 y.o. male in no acute distress. He appears well-developed and well-nourished and well groomed.   HEENT: Normocephalic, atraumatic, pupils are equal, round and reactive to light. He has corrective eyeglasses in place, extraocular tracking is well preserved.  Hearing is grossly intact.  Neck is supple, no carotid bruits.  Airway examination reveals several missing teeth, mild to moderate airway crowding, particularly due to small airway entry and wider appearing uvula,  Mallampati is class III.  Tonsils are absent.  Neck circumference is 17-1/2 inches. Tongue protrudes centrally and palate elevates symmetrically. Mouth opening small. He has a Mild overbite. Nasal inspection reveals no significant nasal mucosal bogginess or redness and no septal deviation.   Chest: Clear to auscultation without wheezing, rhonchi or crackles noted.  Heart: S1+S2+0, regular and normal without murmurs, rubs or gallops noted.   Abdomen: Soft, non-tender and non-distended with normal bowel sounds appreciated on auscultation.  Extremities: There is no pitting edema in the distal lower extremities bilaterally.   Skin: Warm and dry without trophic changes noted.  Musculoskeletal: exam reveals no obvious joint deformities, tenderness or joint swelling or erythema.   Neurologically:  Mental status: The patient is awake, alert and oriented in all 4 spheres. His immediate and remote memory, attention, language skills and fund of knowledge are appropriate. There is no evidence of aphasia, agnosia, apraxia or anomia. Speech is clear with normal prosody and enunciation. Thought process is linear. Mood is normal and affect is normal.  Cranial nerves II - XII are as described above under HEENT exam.  Motor exam: Normal bulk, strength and tone is noted. There is no tremor. Fine motor skills and coordination: grossly intact.  Cerebellar testing: No dysmetria or intention tremor. There is no truncal or gait ataxia.  Sensory exam: intact to light touch.  Gait, station and balance: He stands easily. No veering to one side is noted. No leaning to one side is noted. Posture is age-appropriate and stance is narrow based. Gait shows normal stride length and normal pace. No problems turning are noted.  Assessment and Plan:  In summary, Isaac Mayer is a very pleasant 58 year old male with an underlying medical history of hyperlipidemia, hypertension, depression, impaired glucose tolerance, and  obesity, who Presents for reevaluation of his sleep disorder, with concern for underlying obstructive sleep apnea.  I explained The  diagnosis and potential treatment options to him.  We talked about the risks and ramifications of untreated moderate to severe OSA, especially with respect to developing cardiovascular disease down the Road, including congestive heart failure, difficult to treat hypertension, cardiac arrhythmias, or stroke. Even type 2 diabetes has, in part, been linked to untreated OSA. Symptoms of untreated OSA include daytime sleepiness, memory problems, mood irritability and mood disorder such as depression and anxiety, lack of energy, as well as recurrent headaches, especially morning headaches. We talked about trying to maintain a healthy lifestyle in general, as well as the importance of weight control. I encouraged the patient to Scale back on his caffeine intake.  He reported that he had a large cup of coffee and nothing to eat this morning, blood pressure was a little elevated and pulse rate a little high. He had no symptoms from this. I recommended the following at this time: sleep study. I explained the sleep test procedure to the patient and also outlined possible surgical and non-surgical treatment options of OSA. I explained in particular the CPAP treatment option to the patient, who indicated that he would be willing to try CPAP if the need arises. I plan to see him back after testing.  I answered all his questions today and he was in agreement.   Star Age, MD, PhD

## 2019-02-11 ENCOUNTER — Ambulatory Visit (INDEPENDENT_AMBULATORY_CARE_PROVIDER_SITE_OTHER): Payer: BC Managed Care – PPO | Admitting: Neurology

## 2019-02-11 DIAGNOSIS — R0683 Snoring: Secondary | ICD-10-CM

## 2019-02-11 DIAGNOSIS — E669 Obesity, unspecified: Secondary | ICD-10-CM

## 2019-02-11 DIAGNOSIS — L600 Ingrowing nail: Secondary | ICD-10-CM | POA: Diagnosis not present

## 2019-02-11 DIAGNOSIS — G4720 Circadian rhythm sleep disorder, unspecified type: Secondary | ICD-10-CM

## 2019-02-11 DIAGNOSIS — R519 Headache, unspecified: Secondary | ICD-10-CM

## 2019-02-11 DIAGNOSIS — R351 Nocturia: Secondary | ICD-10-CM

## 2019-02-11 DIAGNOSIS — G4733 Obstructive sleep apnea (adult) (pediatric): Secondary | ICD-10-CM

## 2019-02-11 DIAGNOSIS — M79674 Pain in right toe(s): Secondary | ICD-10-CM | POA: Diagnosis not present

## 2019-02-11 DIAGNOSIS — B351 Tinea unguium: Secondary | ICD-10-CM | POA: Diagnosis not present

## 2019-02-11 DIAGNOSIS — M79675 Pain in left toe(s): Secondary | ICD-10-CM | POA: Diagnosis not present

## 2019-02-11 DIAGNOSIS — G4761 Periodic limb movement disorder: Secondary | ICD-10-CM

## 2019-02-11 DIAGNOSIS — G478 Other sleep disorders: Secondary | ICD-10-CM

## 2019-02-11 DIAGNOSIS — G4719 Other hypersomnia: Secondary | ICD-10-CM

## 2019-02-21 ENCOUNTER — Telehealth: Payer: Self-pay

## 2019-02-21 NOTE — Progress Notes (Signed)
Patient referred by Dr. Ashby Dawes, seen by me on 01/17/19, diagnostic PSG on 02/11/19.    Please call and notify the patient that the recent sleep study did confirm the diagnosis of obstructive sleep apnea. OSA is overall mild, but more severe in REM sleep. It is worth treating to see if he feels better after treatment. To that end I recommend treatment for this in the form of autoPAP, which means, that we don't have to bring him back for a second sleep study with CPAP, but will let him try an autoPAP machine at home, through a DME company (of his choice, or as per insurance requirement). The DME representative will educate him on how to use the machine, how to put the mask on, etc. I have placed an order in the chart. Please send referral, talk to patient, send report to referring MD. We will need a FU in sleep clinic for 10 weeks post-PAP set up, please arrange that with me or one of our NPs. Thanks,   Star Age, MD, PhD [facility])

## 2019-02-21 NOTE — Addendum Note (Signed)
Addended by: Star Age on: 02/21/2019 03:39 PM   Modules accepted: Orders

## 2019-02-21 NOTE — Progress Notes (Signed)
cpap

## 2019-02-21 NOTE — Procedures (Signed)
PATIENT'S NAME:  Isaac Mayer, Isaac Mayer DOB:      27-Jul-1960      MRN:    [id number]     DATE OF RECORDING: 02/11/2019 REFERRING M.D.:  Georgianne Fick, MD Study Performed:   Baseline Polysomnogram HISTORY: 58 year old man with a history of hyperlipidemia, seasonal allergies, hematuria, hypertension, depression, impaired glucose tolerance, and obesity, who reports snoring and daytime somnolence. The patient endorsed the Epworth Sleepiness Scale at 8/24 points. The patient's weight 200 pounds with a height of 66 (inches), resulting in a BMI of 32.2 kg/m2. The patient's neck circumference measured 17.5 inches.  CURRENT MEDICATIONS: Norvasc, Lipitor, Prozac, Prinivil, Mobic.   PROCEDURE:  This is a multichannel digital polysomnogram utilizing the Somnostar 11.2 system.  Electrodes and sensors were applied and monitored per AASM Specifications.   EEG, EOG, Chin and Limb EMG, were sampled at 200 Hz.  ECG, Snore and Nasal Pressure, Thermal Airflow, Respiratory Effort, CPAP Flow and Pressure, Oximetry was sampled at 50 Hz. Digital video and audio were recorded.      BASELINE STUDY: Lights Out was at 22:19 and Lights On at 04:59.  Total recording time (TRT) was 400.5 minutes, with a total sleep time (TST) of 328 minutes.   The patient's sleep latency was 27.5 minutes.  REM latency was 103.5 minutes.  The sleep efficiency was 81.9 %.     SLEEP ARCHITECTURE: WASO (Wake after sleep onset) was 47 minutes with mild sleep fragmentation noted.  There were 11.5 minutes in Stage N1, 208 minutes Stage N2, 63 minutes Stage N3 and 45.5 minutes in Stage REM.  The percentage of Stage N1 was 3.5%, Stage N2 was 63.4%, which is increased, Stage N3 was 19.2% and Stage R (REM sleep) was 13.9%, which is reduced.  RESPIRATORY ANALYSIS:  There were a total of 76 respiratory events:  1 obstructive apneas, 11 central apneas and 0 mixed apneas with a total of 12 apneas and an apnea index (AI) of 2.2 /hour. There were 64 hypopneas  with a hypopnea index of 11.7 /hour. The patient also had 0 respiratory event related arousals (RERAs).      The total APNEA/HYPOPNEA INDEX (AHI) was 13.9 /hour and the total RESPIRATORY DISTURBANCE INDEX was 13.9 /hour.  33 events occurred in REM sleep and 77 events in NREM. The REM AHI was 43.5 /hour, versus a non-REM AHI of 9.1. The patient spent 276.5 minutes of total sleep time in the supine position and 52 minutes in non-supine.. The supine AHI was 15.2/hour versus a non-supine AHI of 7.0/h.  OXYGEN SATURATION & C02:  The Wake baseline 02 saturation was 96%, with the lowest being 86%. Time spent below 89% saturation equaled 2 minutes.  AROUSALS:  The arousals were noted as: 70 were spontaneous, 18 were associated with PLMs, 13 were associated with respiratory events.  The patient had a total of 142 Periodic Limb Movements.  The Periodic Limb Movement (PLM) index was 26. and the PLM Arousal index was 3.3/hour. Audio and video analysis did not show any abnormal or unusual movements, complex behaviors, phonations or vocalizations. He had no nocturia. Mild snoring was noted. The EKG in normal sinus rhythm (NSR).  IMPRESSION:  1. Obstructive Sleep Apnea (OSA) 2. Periodic limb movements of sleep 3. Dysfunctions associated with sleep stages or arousals from sleep   RECOMMENDATIONS:  1. This study demonstrates overall mild obstructive sleep apnea, severe in REM sleep with a total AHI of 13.9/hour, REM AHI of 43.5/hour, supine AHI of 15.2/hour and O2 nadir  of 86%. Given the patient's medical history and sleep related complaints, treatment with positive airway pressure is recommended; this can be achieved in the form of autoPAP. Alternatively, a full-night CPAP titration study would allow optimization of therapy if needed. Other treatment options may include avoidance of supine sleep position along with weight loss, upper airway or jaw surgery in selected patients or the use of an oral appliance in  certain patients. ENT evaluation and/or consultation with a maxillofacial surgeon or dentist may be feasible in some instances.    2. Please note that untreated obstructive sleep apnea may carry additional perioperative morbidity. Patients with significant obstructive sleep apnea should receive perioperative PAP therapy and the surgeons and particularly the anesthesiologist should be informed of the diagnosis and the severity of the sleep disordered breathing. 3. Moderate PLMs (periodic limb movements of sleep) were noted during this study with no significant arousals; clinical correlation is recommended. Medication effect from the antidepressant medication should be considered. PLMs may improve with OSA treatment.  4. The patient should be cautioned not to drive, work at heights, or operate dangerous or heavy equipment when tired or sleepy. Review and reiteration of good sleep hygiene measures should be pursued with any patient. 5. The patient will be seen in follow-up by Dr. Rexene Alberts at [facility] for discussion of the test results and further management strategies. The referring provider will be notified of the test results.  I certify that I have reviewed the entire raw data recording prior to the issuance of this report in accordance with the Standards of Accreditation of the American Academy of Sleep Medicine (AASM)    Star Age, MD, PhD Diplomat, American Board of Neurology and Sleep Medicine (Neurology and Sleep Medicine)

## 2019-02-21 NOTE — Telephone Encounter (Signed)
I called pt to discuss his sleep study results. No answer, left a message asking him to call me back. 

## 2019-02-21 NOTE — Telephone Encounter (Signed)
-----   Message from Star Age, MD sent at 02/21/2019  3:39 PM EST ----- Patient referred by Dr. Ashby Dawes, seen by me on 01/17/19, diagnostic PSG on 02/11/19.    Please call and notify the patient that the recent sleep study did confirm the diagnosis of obstructive sleep apnea. OSA is overall mild, but more severe in REM sleep. It is worth treating to see if he feels better after treatment. To that end I recommend treatment for this in the form of autoPAP, which means, that we don't have to bring him back for a second sleep study with CPAP, but will let him try an autoPAP machine at home, through a DME company (of his choice, or as per insurance requirement). The DME representative will educate him on how to use the machine, how to put the mask on, etc. I have placed an order in the chart. Please send referral, talk to patient, send report to referring MD. We will need a FU in sleep clinic for 10 weeks post-PAP set up, please arrange that with me or one of our NPs. Thanks,   Star Age, MD, PhD [facility])

## 2019-02-26 NOTE — Telephone Encounter (Signed)
I called pt again to discuss his sleep study results. No answer, left a message asking him to call me back. 

## 2019-03-05 NOTE — Telephone Encounter (Signed)
I called pt again to discuss his sleep study results. No answer, left a message asking him to call me back. This is my third unsuccessful attempt at reaching pt by phone. I will send pt a letter asking him to call me back.

## 2019-03-06 DIAGNOSIS — G4733 Obstructive sleep apnea (adult) (pediatric): Secondary | ICD-10-CM | POA: Diagnosis not present

## 2019-03-06 DIAGNOSIS — F320 Major depressive disorder, single episode, mild: Secondary | ICD-10-CM | POA: Diagnosis not present

## 2019-03-06 DIAGNOSIS — I10 Essential (primary) hypertension: Secondary | ICD-10-CM | POA: Diagnosis not present

## 2019-03-06 DIAGNOSIS — F5101 Primary insomnia: Secondary | ICD-10-CM | POA: Diagnosis not present

## 2019-03-06 NOTE — Telephone Encounter (Signed)
Pt presented to the office to discuss his sleep study results. I advised pt that Dr. Rexene Alberts reviewed their sleep study results and found that pt has mild osa overall but severe in REM sleep. Dr. Rexene Alberts recommends that pt start an auto pap at home. I reviewed PAP compliance expectations with the pt. Pt is agreeable to starting an auto-PAP. I advised pt that an order will be sent to a DME, [facility], and [facility] will call the pt within about one week after they file with the pt's insurance. [facility] will show the pt how to use the machine, fit for masks, and troubleshoot the auto-PAP if needed. A follow up appt was made for insurance purposes with Amy, NP on 05/29/19 at 7:30am. Pt verbalized understanding to arrive 15 minutes early and bring their auto-PAP. A letter with all of this information in it will be mailed to the pt as a reminder. I verified with the pt that the address we have on file is correct. Pt verbalized understanding of results. Pt had no questions at this time but was encouraged to call back if questions arise. I have sent the order to [facility] and have received confirmation that they have received the order.

## 2019-03-15 DIAGNOSIS — Z20828 Contact with and (suspected) exposure to other viral communicable diseases: Secondary | ICD-10-CM | POA: Diagnosis not present

## 2019-03-21 DIAGNOSIS — G4733 Obstructive sleep apnea (adult) (pediatric): Secondary | ICD-10-CM | POA: Diagnosis not present

## 2019-05-29 ENCOUNTER — Ambulatory Visit: Payer: Self-pay | Admitting: Family Medicine

## 2019-12-14 IMAGING — MR MRI OF THE LEFT KNEE WITHOUT CONTRAST
7 series · 40 of 40 positions shown · non-contrast
Comparison: None.

CLINICAL DATA: Six week history of knee pain after doing yard work.

EXAM:
MRI OF THE LEFT KNEE WITHOUT CONTRAST
TECHNIQUE: Multiplanar, multisequence MR imaging of the knee was performed. No
intravenous contrast was administered.

[Series 12: T2 fat-sat · axial · left · 4.0mm · 0.50mm/px · z∈[-158,-33]mm · 7 of 26 slices shown (1 of 3)]
[im 1/26]
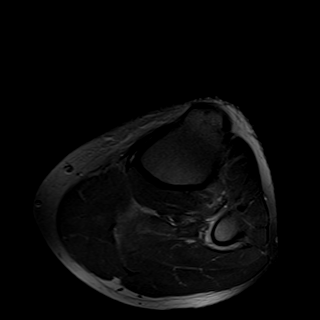
[im 5/26]
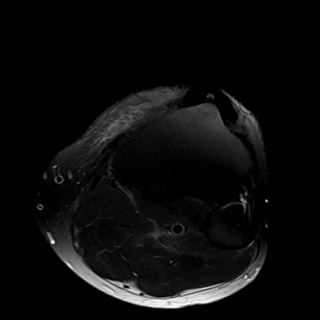
[im 9/26]
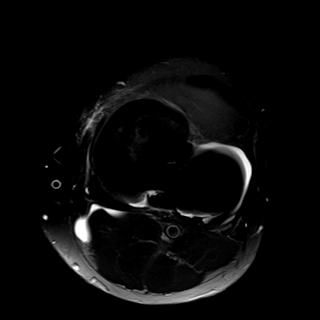
[im 13/26]
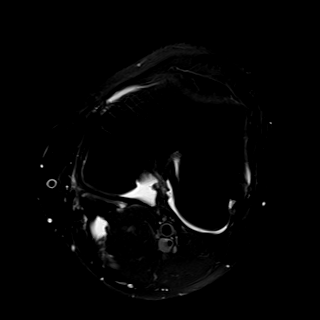
[im 17/26]
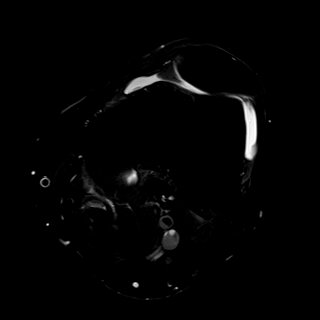
[im 21/26]
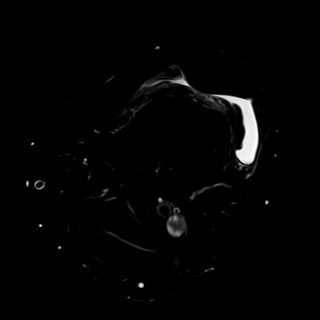
[im 26/26]
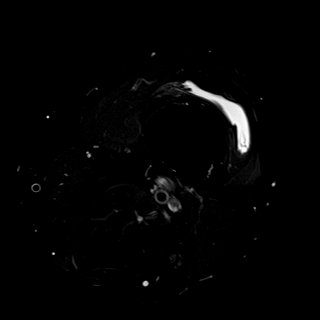

[Series 13: T1 · coronal · left · 4.0mm · 0.59mm/px · 6 of 30 slices shown]
[im 1/30]
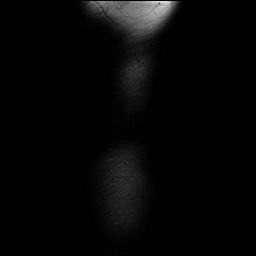
[im 6/30]
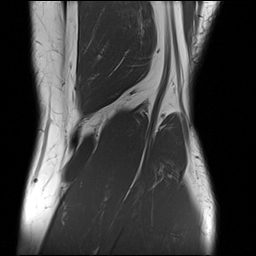
[im 12/30]
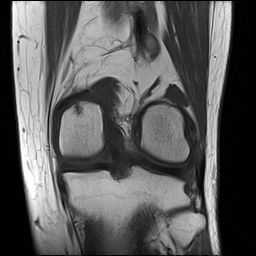
[im 18/30]
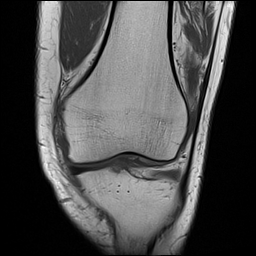
[im 24/30]
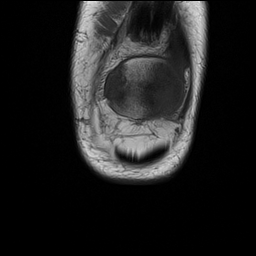
[im 30/30]
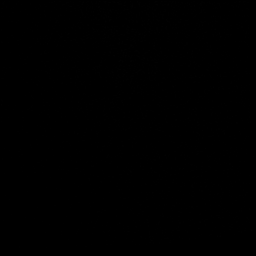

[Series 14: T2 fat-sat · coronal · left · 4.0mm · 0.59mm/px · 6 of 30 slices shown (2 of 3)]
[im 1/30]
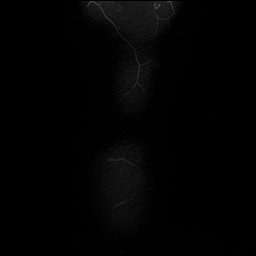
[im 6/30]
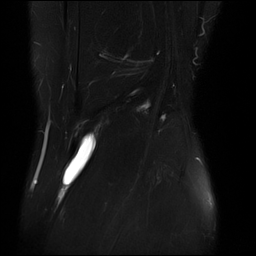
[im 12/30]
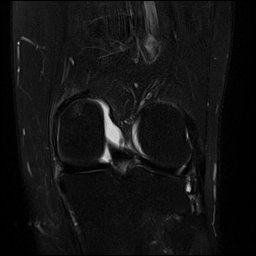
[im 18/30]
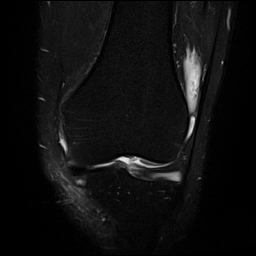
[im 24/30]
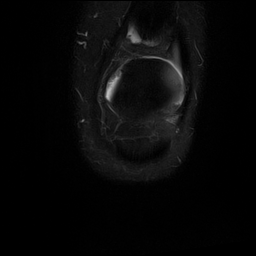
[im 30/30]
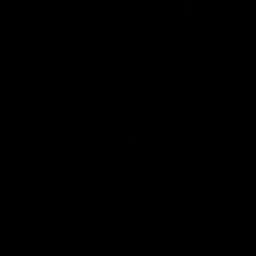

[Series 15: PD fat-sat · coronal · left · 4.0mm · 0.59mm/px · 6 of 30 slices shown (1 of 2)]
[im 1/30]
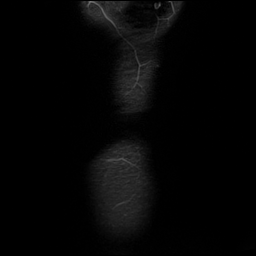
[im 6/30]
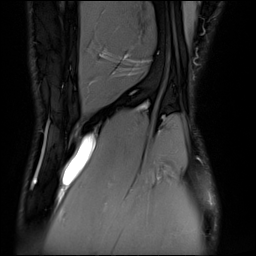
[im 12/30]
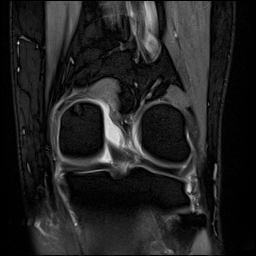
[im 18/30]
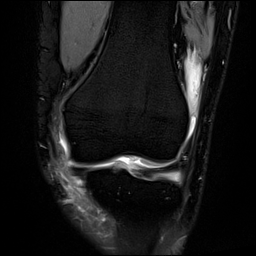
[im 24/30]
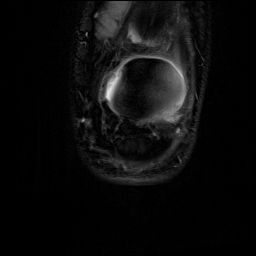
[im 30/30]
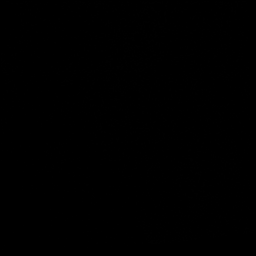

[Series 16: PD fat-sat · sagittal · left · 3.0mm · 0.59mm/px · 6 of 27 slices shown (2 of 2)]
[im 1/27]
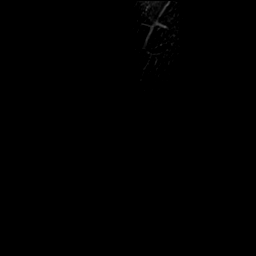
[im 6/27]
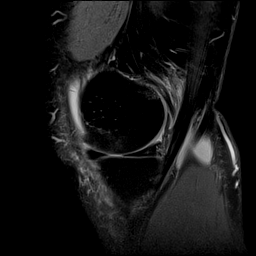
[im 11/27]
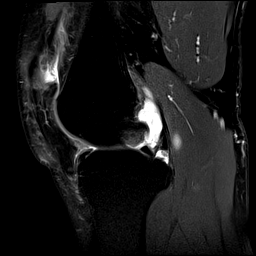
[im 16/27]
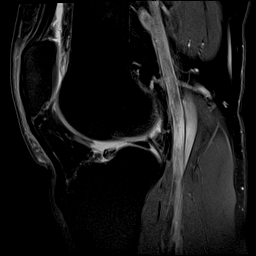
[im 21/27]
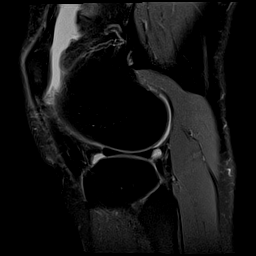
[im 27/27]
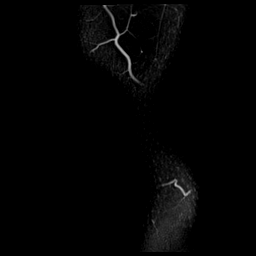

[Series 17: T2 fat-sat · sagittal · left · 3.0mm · 0.59mm/px · 6 of 30 slices shown (3 of 3)]
[im 1/30]
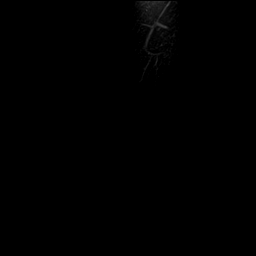
[im 6/30]
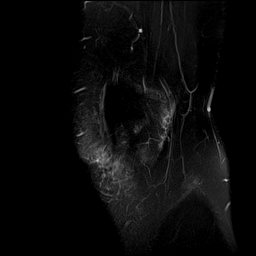
[im 12/30]
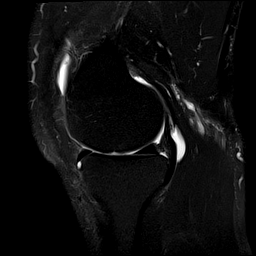
[im 18/30]
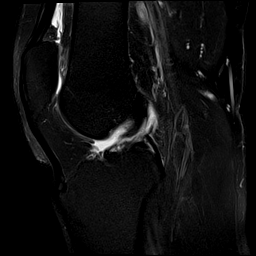
[im 24/30]
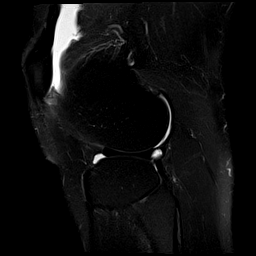
[im 30/30]
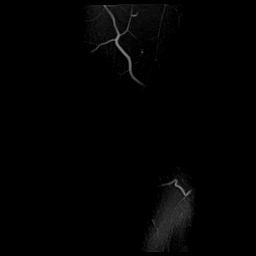

[Series 18: PD · coronal · left · 2.0mm · 0.47mm/px · 3 of 16 slices shown]
[im 1/16]
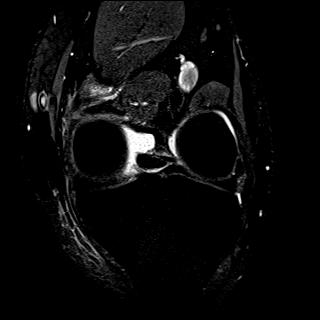
[im 8/16]
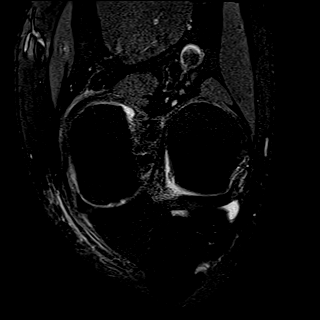
[im 16/16]
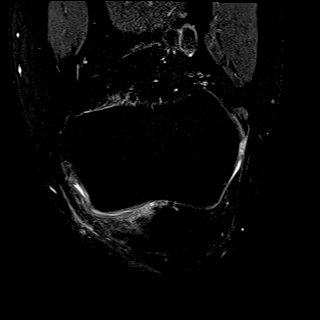

[40 of 40 positions shown; findings below may reference images not displayed]

FINDINGS: MENISCI

Medial meniscus:  Intact

Lateral meniscus:  Intact

LIGAMENTS

Cruciates:  Intact

Collaterals:  Intact

CARTILAGE

Patellofemoral:  Normal

Medial: Moderate degenerative chondrosis with cartilage thinning,
fraying and fibrillation and small cartilage defects. There is also
early joint space narrowing and early spurring.

Lateral:  Mild degenerative chondrosis.

Joint:  Moderate-sized joint effusion.  Mild synovitis.

Popliteal Fossa:  Small Baker's cyst.

Extensor Mechanism: The patella retinacular structures are intact
and the quadriceps and patellar tendons are intact.

Bones: No acute bony findings. No bone contusion, marrow edema or
osteochondral lesion.

Other: Normal knee musculature.
IMPRESSION: 1. Intact ligamentous structures and no acute bony findings.
2. No meniscal tears.
3. Moderate medial compartment degenerative chondrosis with early
joint space narrowing and early spurring.
4. Moderate-sized joint effusion and mild synovitis. Small Baker's
cyst.

## 2020-03-18 ENCOUNTER — Telehealth: Payer: Self-pay | Admitting: Neurology

## 2020-04-06 ENCOUNTER — Ambulatory Visit: Payer: Self-pay | Admitting: Neurology

## 2020-04-27 ENCOUNTER — Ambulatory Visit: Payer: Self-pay | Admitting: Neurology

## 2020-04-28 ENCOUNTER — Telehealth: Payer: Self-pay

## 2020-04-28 NOTE — Telephone Encounter (Signed)
I called patient to reschedule his missed appointment due to weather.  No answer, voicemail not set up yet.  I will send patient a MyChart message.

## 2020-06-02 NOTE — Telephone Encounter (Signed)
Late entry: r/s due to provider being out of office. 

## 2020-09-10 ENCOUNTER — Other Ambulatory Visit: Payer: Self-pay | Admitting: Internal Medicine

## 2020-09-10 DIAGNOSIS — E079 Disorder of thyroid, unspecified: Secondary | ICD-10-CM

## 2020-10-02 ENCOUNTER — Ambulatory Visit
Admission: RE | Admit: 2020-10-02 | Discharge: 2020-10-02 | Disposition: A | Discharge status: Home / Self Care | Payer: No Typology Code available for payment source | Source: Ambulatory Visit | Attending: Internal Medicine | Admitting: Internal Medicine

## 2020-10-02 DIAGNOSIS — E079 Disorder of thyroid, unspecified: Secondary | ICD-10-CM

## 2020-12-11 ENCOUNTER — Other Ambulatory Visit: Payer: Self-pay | Admitting: Orthopedic Surgery

## 2020-12-11 DIAGNOSIS — S76011D Strain of muscle, fascia and tendon of right hip, subsequent encounter: Secondary | ICD-10-CM

## 2020-12-17 ENCOUNTER — Other Ambulatory Visit: Payer: Self-pay | Admitting: Orthopedic Surgery

## 2020-12-17 DIAGNOSIS — S76011D Strain of muscle, fascia and tendon of right hip, subsequent encounter: Secondary | ICD-10-CM

## 2020-12-18 ENCOUNTER — Ambulatory Visit: Payer: No Typology Code available for payment source

## 2020-12-29 ENCOUNTER — Ambulatory Visit
Admission: RE | Admit: 2020-12-29 | Discharge: 2020-12-29 | Disposition: A | Discharge status: Home / Self Care | Payer: No Typology Code available for payment source | Source: Ambulatory Visit | Attending: Orthopedic Surgery | Admitting: Orthopedic Surgery

## 2020-12-29 ENCOUNTER — Other Ambulatory Visit: Payer: Self-pay

## 2020-12-29 DIAGNOSIS — S76011D Strain of muscle, fascia and tendon of right hip, subsequent encounter: Secondary | ICD-10-CM | POA: Diagnosis present

## 2020-12-29 MED ORDER — IOHEXOL 180 MG/ML  SOLN
15.0000 mL | Freq: Once | INTRAMUSCULAR | Status: AC | PRN
  Administered 2020-12-29: 15 mL

## 2020-12-29 MED ORDER — LIDOCAINE HCL (PF) 1 % IJ SOLN
10.0000 mL | Freq: Once | INTRAMUSCULAR | Status: AC
  Administered 2020-12-29: 10 mL via INTRADERMAL
  Filled 2020-12-29: qty 10

## 2020-12-29 MED ORDER — GADOBUTROL 1 MMOL/ML IV SOLN
0.0500 mL | Freq: Once | INTRAVENOUS | Status: AC | PRN
  Administered 2020-12-29: 0.05 mL

## 2021-08-23 ENCOUNTER — Other Ambulatory Visit: Payer: Self-pay

## 2021-08-23 DIAGNOSIS — R937 Abnormal findings on diagnostic imaging of other parts of musculoskeletal system: Secondary | ICD-10-CM

## 2021-08-23 DIAGNOSIS — E041 Nontoxic single thyroid nodule: Secondary | ICD-10-CM

## 2021-08-30 ENCOUNTER — Other Ambulatory Visit: Payer: No Typology Code available for payment source

## 2021-09-02 ENCOUNTER — Ambulatory Visit
Admission: RE | Admit: 2021-09-02 | Discharge: 2021-09-02 | Disposition: A | Discharge status: Home / Self Care | Payer: No Typology Code available for payment source | Source: Ambulatory Visit

## 2021-09-02 DIAGNOSIS — E041 Nontoxic single thyroid nodule: Secondary | ICD-10-CM

## 2021-09-02 DIAGNOSIS — R937 Abnormal findings on diagnostic imaging of other parts of musculoskeletal system: Secondary | ICD-10-CM

## 2021-09-10 ENCOUNTER — Encounter: Payer: Self-pay | Admitting: Internal Medicine

## 2022-03-14 ENCOUNTER — Other Ambulatory Visit (HOSPITAL_COMMUNITY): Payer: Self-pay

## 2022-03-14 MED ORDER — OZEMPIC (0.25 OR 0.5 MG/DOSE) 2 MG/3ML ~~LOC~~ SOPN
0.5000 mg | PEN_INJECTOR | SUBCUTANEOUS | 1 refills | Status: AC
  Filled 2022-03-14: qty 3, 28d supply, fill #0
  Filled 2022-04-30: qty 3, 28d supply, fill #1
  Filled 2022-05-28: qty 3, 28d supply, fill #2
  Filled 2022-06-27: qty 3, 28d supply, fill #0
  Filled 2022-07-22 – 2022-08-04 (×2): qty 3, 28d supply, fill #1
  Filled 2022-08-29: qty 3, 28d supply, fill #2
  Filled 2022-09-26: qty 3, 28d supply, fill #0
  Filled 2022-09-26: qty 3, 28d supply, fill #3
  Filled 2022-10-31: qty 3, 28d supply, fill #1
  Filled 2022-10-31: qty 3, 28d supply, fill #0

## 2022-03-15 ENCOUNTER — Other Ambulatory Visit (HOSPITAL_COMMUNITY): Payer: Self-pay

## 2022-05-03 ENCOUNTER — Other Ambulatory Visit (HOSPITAL_COMMUNITY): Payer: Self-pay

## 2022-05-04 ENCOUNTER — Other Ambulatory Visit (HOSPITAL_COMMUNITY): Payer: Self-pay

## 2022-05-06 ENCOUNTER — Other Ambulatory Visit (HOSPITAL_COMMUNITY): Payer: Self-pay | Admitting: Physician Assistant

## 2022-05-06 DIAGNOSIS — M542 Cervicalgia: Secondary | ICD-10-CM

## 2022-05-06 DIAGNOSIS — R519 Headache, unspecified: Secondary | ICD-10-CM

## 2022-05-12 ENCOUNTER — Other Ambulatory Visit (HOSPITAL_COMMUNITY): Payer: Self-pay

## 2022-05-19 ENCOUNTER — Ambulatory Visit
Admission: RE | Admit: 2022-05-19 | Discharge: 2022-05-19 | Disposition: A | Discharge status: Home / Self Care | Payer: No Typology Code available for payment source | Source: Ambulatory Visit | Attending: Physician Assistant | Admitting: Physician Assistant

## 2022-05-19 DIAGNOSIS — R519 Headache, unspecified: Secondary | ICD-10-CM | POA: Insufficient documentation

## 2022-05-19 DIAGNOSIS — M542 Cervicalgia: Secondary | ICD-10-CM | POA: Diagnosis present

## 2022-05-30 ENCOUNTER — Other Ambulatory Visit: Payer: Self-pay

## 2022-05-31 ENCOUNTER — Other Ambulatory Visit (HOSPITAL_COMMUNITY): Payer: Self-pay

## 2022-05-31 ENCOUNTER — Encounter (HOSPITAL_COMMUNITY): Payer: Self-pay

## 2022-06-01 ENCOUNTER — Other Ambulatory Visit (HOSPITAL_COMMUNITY): Payer: Self-pay

## 2022-06-01 MED ORDER — AMOXICILLIN 500 MG PO CAPS
2000.0000 mg | ORAL_CAPSULE | ORAL | 0 refills | Status: AC
  Filled 2022-06-01: qty 22, 6d supply, fill #0

## 2022-06-10 ENCOUNTER — Other Ambulatory Visit: Payer: Self-pay

## 2022-06-14 ENCOUNTER — Other Ambulatory Visit (HOSPITAL_COMMUNITY): Payer: Self-pay

## 2022-06-27 ENCOUNTER — Other Ambulatory Visit (HOSPITAL_COMMUNITY): Payer: Self-pay

## 2022-06-27 ENCOUNTER — Other Ambulatory Visit: Payer: Self-pay

## 2022-06-27 ENCOUNTER — Other Ambulatory Visit (HOSPITAL_BASED_OUTPATIENT_CLINIC_OR_DEPARTMENT_OTHER): Payer: Self-pay

## 2022-07-22 ENCOUNTER — Other Ambulatory Visit (HOSPITAL_COMMUNITY): Payer: Self-pay

## 2022-07-29 ENCOUNTER — Other Ambulatory Visit: Payer: Self-pay

## 2022-07-29 ENCOUNTER — Other Ambulatory Visit (HOSPITAL_COMMUNITY): Payer: Self-pay

## 2022-08-04 ENCOUNTER — Other Ambulatory Visit (HOSPITAL_COMMUNITY): Payer: Self-pay

## 2022-08-29 ENCOUNTER — Other Ambulatory Visit (HOSPITAL_COMMUNITY): Payer: Self-pay

## 2022-09-26 ENCOUNTER — Other Ambulatory Visit (HOSPITAL_BASED_OUTPATIENT_CLINIC_OR_DEPARTMENT_OTHER): Payer: Self-pay

## 2022-09-26 ENCOUNTER — Other Ambulatory Visit (HOSPITAL_COMMUNITY): Payer: Self-pay

## 2022-10-31 ENCOUNTER — Other Ambulatory Visit (HOSPITAL_COMMUNITY): Payer: Self-pay

## 2022-10-31 ENCOUNTER — Other Ambulatory Visit (HOSPITAL_BASED_OUTPATIENT_CLINIC_OR_DEPARTMENT_OTHER): Payer: Self-pay

## 2022-10-31 ENCOUNTER — Other Ambulatory Visit: Payer: Self-pay

## 2022-11-14 ENCOUNTER — Telehealth: Payer: Self-pay

## 2022-11-14 NOTE — Telephone Encounter (Signed)
Patient left a message on voicemail at 11 stating that he schedule his colonoscopy but did not write down the time or date and is wanting to know when it is. Return patient call and informed him that he does not have a referral with our office or a procedure schedule. I informed him it could of been schedule with [facility]. He states he has there number and will call them

## 2022-11-29 ENCOUNTER — Other Ambulatory Visit (HOSPITAL_BASED_OUTPATIENT_CLINIC_OR_DEPARTMENT_OTHER): Payer: Self-pay

## 2022-11-29 ENCOUNTER — Other Ambulatory Visit (HOSPITAL_COMMUNITY): Payer: Self-pay

## 2022-12-05 ENCOUNTER — Other Ambulatory Visit (HOSPITAL_COMMUNITY): Payer: Self-pay

## 2022-12-08 ENCOUNTER — Other Ambulatory Visit (HOSPITAL_COMMUNITY): Payer: Self-pay

## 2023-02-16 ENCOUNTER — Other Ambulatory Visit: Payer: Self-pay

## 2023-02-28 ENCOUNTER — Other Ambulatory Visit (HOSPITAL_COMMUNITY): Payer: Self-pay

## 2023-03-29 ENCOUNTER — Ambulatory Visit: Payer: No Typology Code available for payment source | Admitting: Anesthesiology

## 2023-03-29 ENCOUNTER — Other Ambulatory Visit: Payer: Self-pay

## 2023-03-29 ENCOUNTER — Encounter
Admission: RE | Disposition: A | Discharge status: Home / Self Care | Payer: Self-pay | Source: Home / Self Care | Attending: Internal Medicine

## 2023-03-29 ENCOUNTER — Ambulatory Visit
Admission: RE | Admit: 2023-03-29 | Discharge: 2023-03-29 | Disposition: A | Discharge status: Home / Self Care | Payer: No Typology Code available for payment source | Attending: Internal Medicine | Admitting: Internal Medicine

## 2023-03-29 ENCOUNTER — Encounter: Payer: Self-pay | Admitting: Internal Medicine

## 2023-03-29 DIAGNOSIS — K5140 Inflammatory polyps of colon without complications: Secondary | ICD-10-CM | POA: Diagnosis not present

## 2023-03-29 DIAGNOSIS — K640 First degree hemorrhoids: Secondary | ICD-10-CM | POA: Insufficient documentation

## 2023-03-29 DIAGNOSIS — D120 Benign neoplasm of cecum: Secondary | ICD-10-CM | POA: Diagnosis not present

## 2023-03-29 DIAGNOSIS — K5730 Diverticulosis of large intestine without perforation or abscess without bleeding: Secondary | ICD-10-CM | POA: Insufficient documentation

## 2023-03-29 DIAGNOSIS — K635 Polyp of colon: Secondary | ICD-10-CM | POA: Insufficient documentation

## 2023-03-29 DIAGNOSIS — Z1211 Encounter for screening for malignant neoplasm of colon: Secondary | ICD-10-CM | POA: Diagnosis present

## 2023-03-29 DIAGNOSIS — I10 Essential (primary) hypertension: Secondary | ICD-10-CM | POA: Diagnosis not present

## 2023-03-29 DIAGNOSIS — Z83719 Family history of colon polyps, unspecified: Secondary | ICD-10-CM | POA: Insufficient documentation

## 2023-03-29 HISTORY — PX: COLONOSCOPY WITH PROPOFOL: SHX5780

## 2023-03-29 HISTORY — PX: POLYPECTOMY: SHX5525

## 2023-03-29 HISTORY — PX: HOT HEMOSTASIS: SHX5433

## 2023-03-29 SURGERY — COLONOSCOPY WITH PROPOFOL
Anesthesia: General

## 2023-03-29 MED ORDER — SODIUM CHLORIDE 0.9 % IV SOLN: INTRAVENOUS | Status: DC

## 2023-03-29 MED ORDER — LIDOCAINE HCL (CARDIAC) PF 100 MG/5ML IV SOSY
PREFILLED_SYRINGE | INTRAVENOUS | Status: DC | PRN
  Administered 2023-03-29: 80 mg via INTRAVENOUS

## 2023-03-29 MED ORDER — PROPOFOL 500 MG/50ML IV EMUL
INTRAVENOUS | Status: DC | PRN
  Administered 2023-03-29: 75 ug/kg/min via INTRAVENOUS

## 2023-03-29 MED ORDER — DEXMEDETOMIDINE HCL IN NACL 80 MCG/20ML IV SOLN
INTRAVENOUS | Status: AC
  Filled 2023-03-29: qty 60

## 2023-03-29 MED ORDER — PHENYLEPHRINE 80 MCG/ML (10ML) SYRINGE FOR IV PUSH (FOR BLOOD PRESSURE SUPPORT)
PREFILLED_SYRINGE | INTRAVENOUS | Status: DC | PRN
  Administered 2023-03-29: 80 ug via INTRAVENOUS

## 2023-03-29 MED ORDER — DEXMEDETOMIDINE HCL IN NACL 200 MCG/50ML IV SOLN
INTRAVENOUS | Status: DC | PRN
  Administered 2023-03-29: 20 ug via INTRAVENOUS

## 2023-03-29 MED ORDER — PROPOFOL 10 MG/ML IV BOLUS
INTRAVENOUS | Status: DC | PRN
  Administered 2023-03-29 (×2): 50 mg via INTRAVENOUS

## 2023-03-29 NOTE — Anesthesia Preprocedure Evaluation (Signed)
Anesthesia Evaluation  Patient identified by MRN, date of birth, ID band Patient awake    Reviewed: Allergy & Precautions, NPO status , Patient's Chart, lab work & pertinent test results  Airway Mallampati: III  TM Distance: >3 FB Neck ROM: full    Dental  (+) Chipped   Pulmonary neg pulmonary ROS, former smoker   Pulmonary exam normal        Cardiovascular hypertension, On Medications negative cardio ROS Normal cardiovascular exam     Neuro/Psych  PSYCHIATRIC DISORDERS  Depression    negative neurological ROS     GI/Hepatic negative GI ROS, Neg liver ROS,,,  Endo/Other  negative endocrine ROS    Renal/GU negative Renal ROS  negative genitourinary   Musculoskeletal   Abdominal   Peds  Hematology negative hematology ROS (+)   Anesthesia Other Findings Past Medical History: No date: Hematuria No date: Hypercholesteremia No date: Hypertension No date: Impaired glucose tolerance No date: MDD (major depressive disorder) No date: Sinusitis  Past Surgical History: No date: COLONOSCOPY No date: thumb surgery; Left  BMI    Body Mass Index: 30.83 kg/m      Reproductive/Obstetrics negative OB ROS                             Anesthesia Physical Anesthesia Plan  ASA: 2  Anesthesia Plan: General   Post-op Pain Management: Minimal or no pain anticipated   Induction: Intravenous  PONV Risk Score and Plan: 3 and Propofol infusion, TIVA and Ondansetron  Airway Management Planned: Nasal Cannula  Additional Equipment: None  Intra-op Plan:   Post-operative Plan:   Informed Consent: I have reviewed the patients History and Physical, chart, labs and discussed the procedure including the risks, benefits and alternatives for the proposed anesthesia with the patient or authorized representative who has indicated his/her understanding and acceptance.     Dental advisory given  Plan  Discussed with: CRNA and Surgeon  Anesthesia Plan Comments: (Discussed risks of anesthesia with patient, including possibility of difficulty with spontaneous ventilation under anesthesia necessitating airway intervention, PONV, and rare risks such as cardiac or respiratory or neurological events, and allergic reactions. Discussed the role of CRNA in patient's perioperative care. Patient understands.)       Anesthesia Quick Evaluation

## 2023-03-29 NOTE — Interval H&P Note (Signed)
History and Physical Interval Note:  03/29/2023 1:09 PM  Isaac Mayer  has presented today for surgery, with the diagnosis of Z12.11  - Colon cancer screening Z83.719  - Family hx colonic polyps.  The various methods of treatment have been discussed with the patient and family. After consideration of risks, benefits and other options for treatment, the patient has consented to  Procedure(s) with comments: COLONOSCOPY WITH PROPOFOL (N/A) - DM as a surgical intervention.  The patient's history has been reviewed, patient examined, no change in status, stable for surgery.  I have reviewed the patient's chart and labs.  Questions were answered to the patient's satisfaction.     [place], [place]

## 2023-03-29 NOTE — Op Note (Addendum)
[facility] Gastroenterology Patient Name: Isaac Mayer Procedure Date: 03/29/2023 12:16 PM MRN: [id number] Account #: [id number] Date of Birth: 06/24/1960 Admit Type: Outpatient Age: 62 Room: [facility] ENDO ROOM 2 Gender: Male Note Status: Supervisor Override Instrument Name: Nelda Marseille [id number] Procedure:             Colonoscopy Indications:           Screening for colorectal malignant neoplasm, Colon                         cancer screening in patient at increased risk: Family                         history of 1st-degree relative with colon polyps Providers:             Boykin Nearing. Square Jowett MD, MD Medicines:             Propofol per Anesthesia Complications:         No immediate complications. Estimated blood loss: None. Procedure:             Pre-Anesthesia Assessment:                        - The risks and benefits of the procedure and the                         sedation options and risks were discussed with the                         patient. All questions were answered and informed                         consent was obtained.                        - Patient identification and proposed procedure were                         verified prior to the procedure by the nurse. The                         procedure was verified in the procedure room.                        - ASA Grade Assessment: III - A patient with severe                         systemic disease.                        - After reviewing the risks and benefits, the patient                         was deemed in satisfactory condition to undergo the                         procedure.                        After obtaining informed consent, the colonoscope was  passed under direct vision. Throughout the procedure,                         the patient's blood pressure, pulse, and oxygen                         saturations were monitored continuously. The                          Colonoscope was introduced through the anus and                         advanced to the the cecum, identified by appendiceal                         orifice and ileocecal valve. The colonoscopy was                         performed without difficulty. The patient tolerated                         the procedure well. The quality of the bowel                         preparation was adequate. The ileocecal valve,                         appendiceal orifice, and rectum were photographed. Findings:      The perianal and digital rectal examinations were normal. Pertinent       negatives include normal sphincter tone and no palpable rectal lesions.      Non-bleeding internal hemorrhoids were found during retroflexion. The       hemorrhoids were Grade I (internal hemorrhoids that do not prolapse).      A 5 mm polyp was found in the cecum. The polyp was sessile. The polyp       was removed with a cold snare. Resection and retrieval were complete.      A 3 mm polyp was found in the cecum. The polyp was sessile. The polyp       was removed with a cold biopsy forceps. Resection and retrieval were       complete.      A 14 mm polyp was found in the proximal ascending colon. The polyp was       sessile. The polyp was removed with a hot snare. Resection and retrieval       were complete.      An 8 mm polyp was found in the descending colon. The polyp was sessile.       The polyp was removed with a hot snare. Resection and retrieval were       complete.      A single large-mouthed diverticulum was found in the sigmoid colon.      The exam was otherwise without abnormality. Impression:            - Non-bleeding internal hemorrhoids.                        - One 5 mm polyp in the cecum, removed with a cold  snare. Resected and retrieved.                        - One 3 mm polyp in the cecum, removed with a cold                         biopsy forceps. Resected and retrieved.                         - One 14 mm polyp in the proximal ascending colon,                         removed with a hot snare. Resected and retrieved.                        - One 8 mm polyp in the descending colon, removed with                         a hot snare. Resected and retrieved.                        - Diverticulosis in the sigmoid colon.                        - The examination was otherwise normal. Recommendation:        - Patient has a contact number available for                         emergencies. The signs and symptoms of potential                         delayed complications were discussed with the patient.                         Return to normal activities tomorrow. Written                         discharge instructions were provided to the patient.                        - Resume previous diet.                        - Continue present medications.                        - Repeat colonoscopy is recommended for surveillance.                         The colonoscopy date will be determined after                         pathology results from today's exam become available                         for review.                        - Return to GI office PRN.                        -  The findings and recommendations were discussed with                         the patient. Procedure Code(s):     --- Professional ---                        [phone], Colonoscopy, flexible; with removal of                         tumor(s), polyp(s), or other lesion(s) by snare                         technique                        45380, 59, Colonoscopy, flexible; with biopsy, single                         or multiple Diagnosis Code(s):     --- Professional ---                        K57.30, Diverticulosis of large intestine without                         perforation or abscess without bleeding                        K64.0, First degree hemorrhoids                        D12.4, Benign neoplasm of  descending colon                        D12.0, Benign neoplasm of cecum                        D12.2, Benign neoplasm of ascending colon                        Z12.11, Encounter for screening for malignant neoplasm                         of colon CPT copyright 2022 American Medical Association. All rights reserved. The codes documented in this report are preliminary and upon coder review may  be revised to meet current compliance requirements. Stanton Kidney MD, MD 03/29/2023 1:41:42 PM This report has been signed electronically. Number of Addenda: 0 Note Initiated On: 03/29/2023 12:16 PM Scope Withdrawal Time: 0 hours 9 minutes 39 seconds  Total Procedure Duration: 0 hours 15 minutes 5 seconds  Estimated Blood Loss:  Estimated blood loss: none. Estimated blood loss: none.      [facility]

## 2023-03-29 NOTE — Transfer of Care (Signed)
Immediate Anesthesia Transfer of Care Note  Patient: Isaac Mayer  Procedure(s) Performed: COLONOSCOPY WITH PROPOFOL HOT HEMOSTASIS (ARGON PLASMA COAGULATION/BICAP) POLYPECTOMY  Patient Location: PACU  Anesthesia Type:General  Level of Consciousness: sedated  Airway & Oxygen Therapy: Patient Spontanous Breathing  Post-op Assessment: Report given to RN and Post -op Vital signs reviewed and stable  Post vital signs: Reviewed and stable  Last Vitals:  Vitals Value Taken Time  BP    Temp    Pulse 82 03/29/23 1340  Resp 11 03/29/23 1340  SpO2 99 % 03/29/23 1340  Vitals shown include unfiled device data.  Last Pain:  Vitals:   03/29/23 1232  TempSrc: Temporal         Complications: No notable events documented.

## 2023-03-29 NOTE — H&P (Signed)
Outpatient short stay form Pre-procedure 03/29/2023 1:08 PM Isaac Mayer K. Isaac Mayer, M.D.  Primary Physician: Debbra Riding, PA-C  Reason for visit:  Colon cancer screening  History of present illness:  Patient presents for colonoscopy for colon cancer screening. The patient denies complaints of abdominal pain, significant change in bowel habits, or rectal bleeding.      Current Facility-Administered Medications:    0.9 %  sodium chloride infusion, , Intravenous, Continuous, Isaac, Boykin Nearing, MD, Last Rate: 20 mL/hr at 03/29/23 1238, New Bag at 03/29/23 1238  Medications Prior to Admission  Medication Sig Dispense Refill Last Dose/Taking   amLODipine (NORVASC) 10 MG tablet Take 10 mg by mouth daily.   Past Week   atorvastatin (LIPITOR) 40 MG tablet Take 40 mg by mouth daily.   Past Week   meloxicam (MOBIC) 15 MG tablet Take 15 mg by mouth daily.   Past Week   Semaglutide,0.25 or 0.5MG /DOS, (OZEMPIC, 0.25 OR 0.5 MG/DOSE,) 2 MG/3ML SOPN Inject 0.5 mg into the skin once a week. 12 mL 1 03/19/2023   FLUoxetine (PROZAC) 40 MG capsule TK ONE C PO  DAILY  6    lisinopril (PRINIVIL,ZESTRIL) 10 MG tablet TK 1 T PO QD  6      No Known Allergies   Past Medical History:  Diagnosis Date   Hematuria    Hypercholesteremia    Hypertension    Impaired glucose tolerance    MDD (major depressive disorder)    Sinusitis     Review of systems:  Otherwise negative.    Physical Exam  Gen: Alert, oriented. Appears stated age.  HEENT: [place]/AT. PERRLA. Lungs: CTA, no wheezes. CV: RR nl S1, S2. Abd: soft, benign, no masses. BS+ Ext: No edema. Pulses 2+    Planned procedures: Proceed with colonoscopy. The patient understands the nature of the planned procedure, indications, risks, alternatives and potential complications including but not limited to bleeding, infection, perforation, damage to internal organs and possible oversedation/side effects from anesthesia. The patient agrees and gives  consent to proceed.  Please refer to procedure notes for findings, recommendations and patient disposition/instructions.     Isaac Mayer K. Isaac Mayer, M.D. Gastroenterology 03/29/2023  1:08 PM

## 2023-03-30 ENCOUNTER — Encounter: Payer: Self-pay | Admitting: Internal Medicine

## 2023-03-30 LAB — SURGICAL PATHOLOGY

## 2023-03-30 NOTE — Anesthesia Postprocedure Evaluation (Signed)
Anesthesia Post Note  Patient: Isaac Mayer  Procedure(s) Performed: COLONOSCOPY WITH PROPOFOL HOT HEMOSTASIS (ARGON PLASMA COAGULATION/BICAP) POLYPECTOMY  Patient location during evaluation: Endoscopy Anesthesia Type: General Level of consciousness: awake and alert Pain management: pain level controlled Vital Signs Assessment: post-procedure vital signs reviewed and stable Respiratory status: spontaneous breathing, nonlabored ventilation, respiratory function stable and patient connected to nasal cannula oxygen Cardiovascular status: blood pressure returned to baseline and stable Postop Assessment: no apparent nausea or vomiting Anesthetic complications: no  No notable events documented.   Last Vitals:  Vitals:   03/29/23 1350 03/29/23 1400  BP: 107/77 121/79  Pulse: 79 78  Resp: 11 15  Temp:    SpO2: 99% 99%    Last Pain:  Vitals:   03/30/23 0818  TempSrc:   PainSc: 0-No pain                 Stephanie Coup

## 2023-05-18 ENCOUNTER — Other Ambulatory Visit (HOSPITAL_COMMUNITY): Payer: Self-pay

## 2023-05-18 MED ORDER — CEPHALEXIN 500 MG PO CAPS
500.0000 mg | ORAL_CAPSULE | Freq: Two times a day (BID) | ORAL | 0 refills | Status: AC
  Filled 2023-05-18: qty 20, 10d supply, fill #0

## 2023-05-18 MED ORDER — METHYLPREDNISOLONE 4 MG PO TBPK
ORAL_TABLET | ORAL | 0 refills | Status: AC
  Filled 2023-05-18: qty 21, 6d supply, fill #0

## 2023-05-18 MED ORDER — NEOMYCIN-POLYMYXIN-DEXAMETH 3.5-10000-0.1 OP SUSP
1.0000 [drp] | Freq: Four times a day (QID) | OPHTHALMIC | 0 refills | Status: AC
  Filled 2023-05-18: qty 5, 25d supply, fill #0

## 2023-05-18 MED ORDER — NEOMYCIN-POLYMYXIN-DEXAMETH 3.5-10000-0.1 OP OINT
1.0000 | TOPICAL_OINTMENT | Freq: Every day | OPHTHALMIC | 0 refills | Status: AC
  Filled 2023-05-18: qty 3.5, 3d supply, fill #0

## 2023-09-28 ENCOUNTER — Ambulatory Visit: Attending: Orthopedic Surgery | Admitting: Physical Therapy

## 2023-09-28 DIAGNOSIS — M5459 Other low back pain: Secondary | ICD-10-CM | POA: Insufficient documentation

## 2023-09-28 NOTE — Therapy (Addendum)
 OUTPATIENT PHYSICAL THERAPY THORACOLUMBAR EVALUATION   Patient Name: Isaac Mayer MRN: [id number] DOB:1961-02-20, 63 y.o., male Today's Date: 09/28/2023  END OF SESSION:  PT End of Session - 09/28/23 0839     Visit Number 1    Number of Visits 24    Authorization Type [employer] Medicare 2025    Authorization - Visit Number 1    Authorization - Number of Visits 24    Progress Note Due on Visit 10    PT Start Time 0815    PT Stop Time 0900    PT Time Calculation (min) 45 min    Activity Tolerance Patient tolerated treatment well    Behavior During Therapy WFL for tasks assessed/performed          Past Medical History:  Diagnosis Date   Hematuria    Hypercholesteremia    Hypertension    Impaired glucose tolerance    MDD (major depressive disorder)    Sinusitis    Past Surgical History:  Procedure Laterality Date   COLONOSCOPY     COLONOSCOPY WITH PROPOFOL  N/A 03/29/2023   Procedure: COLONOSCOPY WITH PROPOFOL ;  Surgeon: Toledo, Alphonsus Jeans, MD;  Location: [facility] ENDOSCOPY;  Service: Gastroenterology;  Laterality: N/A;  DM   HOT HEMOSTASIS  03/29/2023   Procedure: HOT HEMOSTASIS (ARGON PLASMA COAGULATION/BICAP);  Surgeon: Corky Diener, Alphonsus Jeans, MD;  Location: [facility] ENDOSCOPY;  Service: Gastroenterology;;   POLYPECTOMY  03/29/2023   Procedure: POLYPECTOMY;  Surgeon: Corky Diener, Alphonsus Jeans, MD;  Location: [facility] ENDOSCOPY;  Service: Gastroenterology;;   thumb surgery Left    Patient Active Problem List   Diagnosis Date Noted   HTN (hypertension) 07/09/2011   Dyslipidemia 07/09/2011    PCP:  Bruce Caper PA-C   REFERRING PROVIDER: Dr. 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 The pain tends to come and go and when he does experience the pain it is localized to the central spine of the low back and it radiates across his low back, but it does not radiate down his legs. He experiences most of the pain when  rotating and he has also felt increased muscle tension in his low back.   PAIN:  Are you having pain? Yes: NPRS scale: 3-4/10, worst 9/10   Pain location: Central spinous process of L2-L5  Pain description: Sharp and shooting   Aggravating factors: Rotating   Relieving factors: Forward flexion or leaning forward    PRECAUTIONS: None  RED FLAGS: None   WEIGHT BEARING RESTRICTIONS: No  FALLS:  Has patient fallen in last 6 months? 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   PLOF: Independent  PATIENT GOALS: He wants to experience less episodes of his low back locking up.   NEXT MD VISIT: Waiting to schedule once physical therapy is completed   OBJECTIVE:  Note: Objective measures were completed at Evaluation unless otherwise noted.  VITALS: BP 156/94  HR 84 SpO2 100   DIAGNOSTIC FINDINGS: Per Menz's Note  June 2025  X-rays suggest probable spondylolisthesis at L5-S1 without significant symptoms of nerve compression or spinal instability. It likely contributes to mechanical low back pain. Educate on maintaining flexibility and activity to manage stiffness and prevent progression. Monitor symptoms and reassess if pain radiates to legs or if neurological symptoms develop. Diagnoses and all orders for this visit:    PATIENT SURVEYS:  MODI- NT    COGNITION: Overall cognitive status: Within functional limits for tasks assessed     SENSATION: WFL  MUSCLE LENGTH: Hamstrings: Right 70 deg; Left 70 deg Thomas test: Not performed  Ely's Test: Positive Bilaterally    POSTURE: No Significant postural limitations  PALPATION: Increased tension in bilateral paraspinals  LUMBAR ROM:   AROM eval  Flexion 100%  Extension 50%*  Right lateral flexion 100%  Left lateral flexion 100%  Right rotation 100%  Left rotation 100%   (Blank rows = not tested)  LOWER EXTREMITY ROM:       LOWER EXTREMITY MMT:     MMT  Right eval Left eval  Hip flexion 4 4  Hip extension 3-* 3-*  Hip abduction 4 4  Hip adduction 4 4  Hip internal rotation 4 4  Hip external rotation 4 4  Knee flexion 4 4  Knee extension 4 4  Ankle dorsiflexion    Ankle plantarflexion    Ankle inversion    Ankle eversion     (Blank rows = not tested)   LUMBAR SPECIAL TESTS:  Straight leg raise test: Negative, FABER test: Negative, and FADIR: + LLE      TREATMENT DATE:  09/28/23                                                                                                                              Lower Trunk Rotation  2 x 10 with 3 sec hold   Single Knee to Chest 3  sec hold 2 x 10  Prone Quad Stretch 3 x 60 sec     PATIENT EDUCATION:  Education details: Form and technique for correct performance of exercise and explanation of spondylosis  Person educated: Patient Education method: Explanation, Demonstration, Verbal cues, and Handouts Education comprehension: verbalized understanding, returned demonstration, and verbal cues required  HOME EXERCISE PROGRAM: Access Code: [email or web address] URL: [email or web address] Date: 09/28/2023 Prepared by: Marge Shed  Exercises - Prone Quadriceps Stretch with Strap  - 1 x daily - 7 x weekly - 3 reps - 60 sec  hold - Supine Lower Trunk Rotation  - 1 x daily - 7 x weekly - 2 sets - 10 reps - 3  sec  hold - Seated hamstring and calf stretch   - 1 x daily - 7 x weekly - 2 reps - 60 sec hold - Supine Single Knee to Chest Stretch  - 1 x daily - 7 x weekly - 2 sets - 10 reps - 3 sec  hold  ASSESSMENT:  CLINICAL IMPRESSION: Patient is a 63 y.o. AA male who was seen today for physical therapy evaluation and treatment for worsening chronic low back pain that is mechanical in nature. He demonstrates signs and symptoms that make him most appropriate for the movement control group with low pain and disability. His deficits include increased low back pain especially with lumbar extension along with decreased hip strength and flexibility. He has symptoms as well as imaging that indicate he has mechanical pain with pain that worsens with lumbar extension from flexed position and locking in low back pain that elicits pain. He will benefit from skilled PT to address these aforementioned deficits to carry out day to day tasks like walking and bending to maintain his yard and carry out job related tasks without being limited by pain.    OBJECTIVE IMPAIRMENTS: decreased ROM, decreased strength, hypomobility, increased muscle spasms, impaired flexibility, obesity, and pain.   ACTIVITY LIMITATIONS: carrying, lifting, bending, standing, squatting, and transfers  PARTICIPATION LIMITATIONS: community activity, occupation, and yard work  PERSONAL FACTORS: Age, Fitness, Time since onset of injury/illness/exacerbation, and 1-2 comorbidities: HTN, Obesity, Depression are also affecting patient's functional outcome.   REHAB POTENTIAL: Good  CLINICAL DECISION MAKING: Stable/uncomplicated  EVALUATION COMPLEXITY: Low   GOALS: Goals reviewed with patient? No  SHORT TERM GOALS: Target date: 10/12/2023  Patient will demonstrate undestanding of home exercise plan by performing exercises correctly with evidence of good carry over with min to no verbal or tactile cues .  Baseline: NT  Goal status: INITIAL  2.   Patient will show negative Ely's bilaterally as evidence of improvement in hip flexor flexibility to relieve pressure on low back forcing him into lumbar lordosis. Baseline: + Bilaterally Goal status: INITIAL   LONG TERM GOALS: Target date: 12/21/2023  Patient will improve modified Oswestry Disability Index (MODI) score by >=13 points as evidence of the minimal statistically significant change for improvement with low back pain disability and improvement in low back function (Copay et al, 2008) Baseline: NT  Goal status: INITIAL  2.  Patient will reduce the number of locking episodes in low back to <=2 x per day as evidence of improve lumbar function, so that he can carry out day to day physical activities without being interrupted by pain and discomfort.  Baseline: 6-9x locking episodes per day   Goal status: INITIAL  3.  Patient will improve hip strength by 1/3 grade (I.E 4- to 4+) and abdominal strength by >= 1 grade  for improved lumbar stability and to assist low back in absorbing mechanical forces in weight bearing positions for improved symptom response. Baseline: Hip Flex R/L 4/4, Hip Ext R/L 3-/3-, Hip Abd R/L 4/4, Sahrmann Grade NT  Goal status: INITIAL   PLAN:  PT FREQUENCY: 1-2x/week  PT DURATION: 12 weeks  PLANNED INTERVENTIONS: 97164- PT Re-evaluation, 97750- Physical Performance Testing, 97110-Therapeutic exercises, 97530- Therapeutic activity, [id number]- Neuromuscular re-education, 97535- Self Care, 16109- Manual therapy, [id number]- Gait training, [phone]- Aquatic Therapy, [phone]- Electrical stimulation (unattended), [phone]- Electrical stimulation (manual), [id number]- Ultrasound, 29562- Traction (mechanical), [id number]- Wound care (first 20 sq cm), 13086- Wound care (each additional 20 sq cm), 20560 (1-2 muscles), 20561 (3+ muscles)- Dry Needling, Patient/Family education, Balance training, Stair training, Taping, Joint mobilization, Joint manipulation, Spinal manipulation, Spinal mobilization,  Vestibular training, DME instructions, Cryotherapy, and Moist heat.  PLAN FOR NEXT SESSION: MODI Survey, Sahrmann test, Supine Hip Ext Test , Determine directional preference and rule in spondylosisthesis, Introduce hip strengthening exercises   Marge Shed PT, DPT  [facility] [address] [place], [address] Phone: [phone]   Fax:  [phone]

## 2023-10-03 ENCOUNTER — Ambulatory Visit: Admitting: Physical Therapy

## 2023-10-03 DIAGNOSIS — M5459 Other low back pain: Secondary | ICD-10-CM

## 2023-10-03 NOTE — Therapy (Signed)
 OUTPATIENT PHYSICAL THERAPY THORACOLUMBAR TREATMENT     Patient Name: Isaac Mayer MRN: [id number] DOB:27-Mar-1961, 63 y.o., male Today's Date: 10/03/2023  END OF SESSION:  PT End of Session - 10/03/23 1313     Visit Number 2    Number of Visits 24    Date for PT Re-Evaluation 12/21/23    Authorization Type [employer] Medicare 2025    Authorization Time Period 09/28/23-12/03/23    Authorization - Number of Visits 24    Progress Note Due on Visit 10    PT Start Time 1305    PT Stop Time 1345    PT Time Calculation (min) 40 min    Activity Tolerance Patient tolerated treatment well    Behavior During Therapy [facility] for tasks assessed/performed          Past Medical History:  Diagnosis Date   Hematuria    Hypercholesteremia    Hypertension    Impaired glucose tolerance    MDD (major depressive disorder)    Sinusitis    Past Surgical History:  Procedure Laterality Date   COLONOSCOPY     COLONOSCOPY WITH PROPOFOL  N/A 03/29/2023   Procedure: COLONOSCOPY WITH PROPOFOL ;  Surgeon: Toledo, Ladell POUR, MD;  Location: [facility] ENDOSCOPY;  Service: Gastroenterology;  Laterality: N/A;  DM   HOT HEMOSTASIS  03/29/2023   Procedure: HOT HEMOSTASIS (ARGON PLASMA COAGULATION/BICAP);  Surgeon: Aundria, Ladell POUR, MD;  Location: [facility] ENDOSCOPY;  Service: Gastroenterology;;   POLYPECTOMY  03/29/2023   Procedure: POLYPECTOMY;  Surgeon: Aundria, Ladell POUR, MD;  Location: [facility] ENDOSCOPY;  Service: Gastroenterology;;   thumb surgery Left    Patient Active Problem List   Diagnosis Date Noted   HTN (hypertension) 07/09/2011   Dyslipidemia 07/09/2011    PCP:  Selinda Quan PA-C   REFERRING PROVIDER: Dr. Ozell Flake   REFERRING DIAG: Lumbar facet joint syndrome   Rationale for Evaluation and Treatment: Rehabilitation  THERAPY DIAG:  Other low back pain  ONSET DATE: April 2025  SUBJECTIVE:                                                                                                                                                                                            SUBJECTIVE STATEMENT: Pt reports that he feels increased soreness from increased activity because he was working late yesterday. He did not feeling catching as much but more tightness.   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 The pain tends to come and go and when he does experience the pain it is localized to the central spine of  the low back and it radiates across his low back, but it does not radiate down his legs. He experiences most of the pain when rotating and he has also felt increased muscle tension in his low back.   PAIN:  Are you having pain? Yes: NPRS scale: 3-4/10, worst 9/10   Pain location: Central spinous process of L2-L5  Pain description: Sharp and shooting   Aggravating factors: Rotating   Relieving factors: Forward flexion or leaning forward    PRECAUTIONS: None  RED FLAGS: None   WEIGHT BEARING RESTRICTIONS: No  FALLS:  Has patient fallen in last 6 months? 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   PLOF: Independent  PATIENT GOALS: He wants to experience less episodes of his low back locking up.   NEXT MD VISIT: Waiting to schedule once physical therapy is completed   OBJECTIVE:  Note: Objective measures were completed at Evaluation unless otherwise noted.  VITALS: BP 156/94  HR 84 SpO2 100   DIAGNOSTIC FINDINGS: Per Menz's Note  June 2025  X-rays suggest probable spondylolisthesis at L5-S1 without significant symptoms of nerve compression or spinal instability. It likely contributes to mechanical low back pain. Educate on maintaining flexibility and activity to manage stiffness and prevent progression. Monitor symptoms and  reassess if pain radiates to legs or if neurological symptoms develop. Diagnoses and all orders for this visit:    PATIENT SURVEYS:  MODI- NT   COGNITION: Overall cognitive status: Within functional limits for tasks assessed     SENSATION: WFL  MUSCLE LENGTH: Hamstrings: Right 70 deg; Left 70 deg Thomas test: Not performed  Ely's Test: Positive Bilaterally    POSTURE: No Significant postural limitations  PALPATION: Increased tension in bilateral paraspinals  LUMBAR ROM:   AROM eval  Flexion 100%  Extension 50%*  Right lateral flexion 100%  Left lateral flexion 100%  Right rotation 100%  Left rotation 100%   (Blank rows = not tested)  LOWER EXTREMITY ROM:       LOWER EXTREMITY MMT:     MMT  Right eval Left eval  Hip flexion 4 4  Hip extension 3-* 3-*  Hip abduction 4 4  Hip adduction 4 4  Hip internal rotation 4 4  Hip external rotation 4 4  Knee flexion 4 4  Knee extension 4 4  Ankle dorsiflexion    Ankle plantarflexion    Ankle inversion    Ankle eversion     (Blank rows = not tested)   LUMBAR SPECIAL TESTS:  Straight leg raise test: Negative, FABER test: Negative, and FADIR: + LLE      TREATMENT DATE:   10/03/23: JERRE   Lumbar AAROM Ball Rolls (Silver) Center, Right, Left x 30   Sahrmann Level 4   -Able to do Level 5 but his low back arches   Supine Abdominal 90/90 Hip and Knee Heel Touches 3 x 10  -Diastasis Recti observed  MODI 9/50 (18%)  Prone Quad Stretch 3 x 60 sec   Quadruped Hip Extension with flexed knee 1 x 10   Quadruped Hip Extension with extended knee 1 x 10  Quadruped Hip Extension with extended knee and #5 AW 1 x 10    PATIENT EDUCATION:  Education details: Form and technique for correct performance of exercise and explanation of spondylosis  Person educated:  Patient Education method: Explanation, Demonstration, Verbal cues, and Handouts Education comprehension: verbalized understanding, returned demonstration, and  verbal cues required  HOME EXERCISE PROGRAM: Access Code: [email or web address] URL: [email or web address] Date: 10/03/2023 Prepared by: Toribio Servant  Exercises - Prone Quadriceps Stretch with Strap  - 1 x daily - 7 x weekly - 3 reps - 60 sec  hold - Supine Lower Trunk Rotation  - 1 x daily - 7 x weekly - 2 sets - 10 reps - 3 sec  hold - Seated hamstring and calf stretch   - 1 x daily - 7 x weekly - 2 reps - 60 sec hold - Supine Single Knee to Chest Stretch  - 1 x daily - 7 x weekly - 2 sets - 10 reps - 3 sec  hold - Supine 90/90 with Leg Extensions  - 3-4 x weekly - 3 sets - 10 reps - Beginner Front Arm Support  - 3-4 x weekly - 3 sets - 10 reps  ASSESSMENT:  CLINICAL IMPRESSION: Pt demonstrates clear flexion based preference with ability to perform all hip strengthening exercises without provocation of low back pain with quadruped hip extension. He only has minor abdominal strength deficits, but he does have observable diastasis recti with doming when performing abdominal exercises but with no pain. He will continue to benefit from skilled PT to address these aforementioned deficits to carry out day to day tasks like walking and bending to maintain his yard and carry out job related tasks without being limited by pain.     OBJECTIVE IMPAIRMENTS: decreased ROM, decreased strength, hypomobility, increased muscle spasms, impaired flexibility, obesity, and pain.   ACTIVITY LIMITATIONS: carrying, lifting, bending, standing, squatting, and transfers  PARTICIPATION LIMITATIONS: community activity, occupation, and yard work  PERSONAL FACTORS: Age, Fitness, Time since onset of injury/illness/exacerbation, and 1-2 comorbidities: HTN, Obesity, Depression are also affecting patient's functional outcome.   REHAB POTENTIAL: Good  CLINICAL DECISION MAKING: Stable/uncomplicated  EVALUATION COMPLEXITY: Low   GOALS: Goals reviewed with patient? No  SHORT TERM GOALS: Target date:  10/12/2023  Patient will demonstrate undestanding of home exercise plan by performing exercises correctly with evidence of good carry over with min to no verbal or tactile cues .  Baseline: NT 10/03/23: Performing without verbal cues   Goal status: ACHIEVED    2.  Patient will show negative Ely's bilaterally as evidence of improvement in hip flexor flexibility to relieve pressure on low back forcing him into lumbar lordosis. Baseline: + Bilaterally Goal status: ONGOING     LONG TERM GOALS: Target date: 12/21/2023  Patient will improve modified Oswestry Disability Index (MODI) score by >=13 points as evidence of the minimal statistically significant change for improvement with low back pain disability and improvement in low back function (Copay et al, 2008) Baseline: 9/50 (18%) Goal status: ONGOING    2.  Patient will reduce the number of locking episodes in low back to <=2 x per day as evidence of improve lumbar function, so that he can carry out day to day physical activities without being interrupted by pain and discomfort.  Baseline: 6-9x locking episodes per day   Goal status: ONGOING    3.  Patient will improve hip strength by 1/3 grade (I.E 4- to 4+) and abdominal strength by >= 1 grade for improved lumbar stability and to assist low back in absorbing mechanical forces in weight bearing positions for improved symptom response. Baseline: Hip Flex R/L 4/4, Hip Ext R/L 3-/3-, Hip Abd R/L 4/4, Sahrmann Grade Grade  4    Goal status: ONGOING     PLAN:  PT FREQUENCY: 1-2x/week  PT DURATION: 12 weeks  PLANNED INTERVENTIONS: 97164- PT Re-evaluation, 97750- Physical Performance Testing, 97110-Therapeutic exercises, 97530- Therapeutic activity, [id number]- Neuromuscular re-education, 97535- Self Care, 02859- Manual therapy, [id number]- Gait training, [phone]- Aquatic Therapy, [phone]- Electrical stimulation (unattended), [phone]- Electrical stimulation (manual), [id number]- Ultrasound, [id number]- Traction  (mechanical), [id number]- Wound care (first 20 sq cm), 97598- Wound care (each additional 20 sq cm), 20560 (1-2 muscles), 20561 (3+ muscles)- Dry Needling, Patient/Family education, Balance training, Stair training, Taping, Joint mobilization, Joint manipulation, Spinal manipulation, Spinal mobilization, Vestibular training, DME instructions, Cryotherapy, and Moist heat.  PLAN FOR NEXT SESSION:  Supine Hip Ext Test. Bird Dogs. Hip ER in Quadruped. Planks or other abdominal exercises that do not cause doming or out pouching of abdominal musculature.    Toribio Servant PT, DPT  [facility] [address] [place], [address] Phone: [phone]   Fax:  [phone]

## 2023-10-05 ENCOUNTER — Encounter: Payer: Self-pay | Admitting: Physical Therapy

## 2023-10-05 ENCOUNTER — Ambulatory Visit: Admitting: Physical Therapy

## 2023-10-05 DIAGNOSIS — M5459 Other low back pain: Secondary | ICD-10-CM | POA: Diagnosis not present

## 2023-10-05 NOTE — Therapy (Addendum)
 OUTPATIENT PHYSICAL THERAPY THORACOLUMBAR TREATMENT     Patient Name: Isaac Mayer MRN: [id number] DOB:Aug 12, 1960, 63 y.o., male Today's Date: 10/05/2023  END OF SESSION:  PT End of Session - 10/05/23 0958     Visit Number 3    Number of Visits 24    Date for PT Re-Evaluation 12/21/23    Authorization Type [employer] Medicare 2025    Authorization Time Period 09/28/23-12/03/23    Authorization - Visit Number 3    Authorization - Number of Visits 24    Progress Note Due on Visit 10    PT Start Time 0950    PT Stop Time 1030    PT Time Calculation (min) 40 min    Activity Tolerance Patient tolerated treatment well    Behavior During Therapy [facility] for tasks assessed/performed          Past Medical History:  Diagnosis Date   Hematuria    Hypercholesteremia    Hypertension    Impaired glucose tolerance    MDD (major depressive disorder)    Sinusitis    Past Surgical History:  Procedure Laterality Date   COLONOSCOPY     COLONOSCOPY WITH PROPOFOL  N/A 03/29/2023   Procedure: COLONOSCOPY WITH PROPOFOL ;  Surgeon: Toledo, Ladell POUR, MD;  Location: [facility] ENDOSCOPY;  Service: Gastroenterology;  Laterality: N/A;  DM   HOT HEMOSTASIS  03/29/2023   Procedure: HOT HEMOSTASIS (ARGON PLASMA COAGULATION/BICAP);  Surgeon: Aundria, Ladell POUR, MD;  Location: [facility] ENDOSCOPY;  Service: Gastroenterology;;   POLYPECTOMY  03/29/2023   Procedure: POLYPECTOMY;  Surgeon: Aundria, Ladell POUR, MD;  Location: [facility] ENDOSCOPY;  Service: Gastroenterology;;   thumb surgery Left    Patient Active Problem List   Diagnosis Date Noted   HTN (hypertension) 07/09/2011   Dyslipidemia 07/09/2011    PCP:  Selinda Quan PA-C   REFERRING PROVIDER: Dr. Ozell Flake   REFERRING DIAG: Lumbar facet joint syndrome   Rationale for Evaluation and Treatment: Rehabilitation  THERAPY DIAG:  Other low back pain  ONSET DATE: April 2025  SUBJECTIVE:                                                                                                                                                                                            SUBJECTIVE STATEMENT: Pt states ongoing low back stiffness but that he is not experience back pain.   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 The pain tends to come and go and when he does experience the pain it is localized to the central spine of the low back and  it radiates across his low back, but it does not radiate down his legs. He experiences most of the pain when rotating and he has also felt increased muscle tension in his low back.   PAIN:  Are you having pain? Yes: NPRS scale: 3-4/10, worst 9/10   Pain location: Central spinous process of L2-L5  Pain description: Sharp and shooting   Aggravating factors: Rotating   Relieving factors: Forward flexion or leaning forward    PRECAUTIONS: None  RED FLAGS: None   WEIGHT BEARING RESTRICTIONS: No  FALLS:  Has patient fallen in last 6 months? 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   PLOF: Independent  PATIENT GOALS: He wants to experience less episodes of his low back locking up.   NEXT MD VISIT: Waiting to schedule once physical therapy is completed   OBJECTIVE:  Note: Objective measures were completed at Evaluation unless otherwise noted.  VITALS: BP 156/94  HR 84 SpO2 100   DIAGNOSTIC FINDINGS: Per Menz's Note  June 2025  X-rays suggest probable spondylolisthesis at L5-S1 without significant symptoms of nerve compression or spinal instability. It likely contributes to mechanical low back pain. Educate on maintaining flexibility and activity to manage stiffness and prevent progression. Monitor symptoms and reassess if pain radiates to legs or if neurological  symptoms develop. Diagnoses and all orders for this visit:    PATIENT SURVEYS:  MODI- NT   COGNITION: Overall cognitive status: Within functional limits for tasks assessed     SENSATION: WFL  MUSCLE LENGTH: Hamstrings: Right 70 deg; Left 70 deg Thomas test: Not performed  Ely's Test: Positive Bilaterally    POSTURE: No Significant postural limitations  PALPATION: Increased tension in bilateral paraspinals  LUMBAR ROM:   AROM eval  Flexion 100%  Extension 50%*  Right lateral flexion 100%  Left lateral flexion 100%  Right rotation 100%  Left rotation 100%   (Blank rows = not tested)  LOWER EXTREMITY ROM:       LOWER EXTREMITY MMT:     MMT  Right eval Left eval  Hip flexion 4 4  Hip extension 3-* 3-*  Hip abduction 4 4  Hip adduction 4 4  Hip internal rotation 4 4  Hip external rotation 4 4  Knee flexion 4 4  Knee extension 4 4  Ankle dorsiflexion    Ankle plantarflexion    Ankle inversion    Ankle eversion     (Blank rows = not tested)   LUMBAR SPECIAL TESTS:  Straight leg raise test: Negative, FABER test: Negative, and FADIR: + LLE      TREATMENT DATE:   10/05/23: THEREX   Nu-Step with seat and arms at 7 and resistance at 2 for 5 min  Child's Pose Center, Right, Left 3 x 60 sec   Quadruped Cow 3 x 30 sec    Quadruped Cat 3 x 30 sec  -Pt unable to extend thoracic spine   Seated Thoracic Extension with 5 sec hold x 10  Front Plank 6 x 10 sec hold  Left Side Plank 5 x 10 sec hold   Right Side Plank 5 x 10 sec hold   Bird Dog 3 x 10  -min VC to decrease speed of exercise and to maintain flat back      PATIENT EDUCATION:  Education details: Form and  technique for correct performance of exercise and explanation of spondylosis  Person educated: Patient Education method: Explanation, Demonstration, Verbal cues, and Handouts Education comprehension: verbalized understanding, returned demonstration, and verbal cues required  HOME EXERCISE  PROGRAM: Access Code: [email or web address] URL: [email or web address] Date: 10/05/2023 Prepared by: Toribio Servant  Exercises - Prone Quadriceps Stretch with Strap  - 1 x daily - 7 x weekly - 3 reps - 60 sec  hold - Supine Lower Trunk Rotation  - 1 x daily - 7 x weekly - 2 sets - 10 reps - 3 sec  hold - Child's Pose with Sidebending  - 1 x daily - 7 x weekly - 3 reps - 60 sec hold - Child's Pose Stretch  - 1 x daily - 7 x weekly - 3 reps - 60 sec hold - Child's Pose with Sidebending (Mirrored)  - 1 x daily - 7 x weekly - 3 reps - 60 sec hold - Seated hamstring and calf stretch   - 1 x daily - 7 x weekly - 2 reps - 60 sec hold - Supine Single Knee to Chest Stretch  - 1 x daily - 7 x weekly - 2 sets - 10 reps - 3 sec  hold - Bird Dog  - 3-4 x weekly - 3 sets - 10 reps - Standard Plank  - 1 x daily - 7 x weekly - 1 sets - 5 reps - 10 sec  hold - Side Plank on Elbow  - 1 x daily - 7 x weekly - 1 sets - 5 reps - 10 sec  hold - Side Plank on Elbow (Mirrored)  - 1 x daily - 7 x weekly - 1 sets - 5 reps - 10 sec  hold  ASSESSMENT:  CLINICAL IMPRESSION: Pt shows ongoing progress towards goals with ability to perform progressed hip and abdominal exercises without provocation of his symptoms. Modified exercises to decreased lumbar extension and flexion with emphasis on a neutral spine to avoid symptom provocation. He will continue to benefit from skilled PT to address these aforementioned deficits to carry out day to day tasks like walking and bending to maintain his yard and carry out job related tasks without being limited by pain.    OBJECTIVE IMPAIRMENTS: decreased ROM, decreased strength, hypomobility, increased muscle spasms, impaired flexibility, obesity, and pain.   ACTIVITY LIMITATIONS: carrying, lifting, bending, standing, squatting, and transfers  PARTICIPATION LIMITATIONS: community activity, occupation, and yard work  PERSONAL FACTORS: Age, Fitness, Time since onset of  injury/illness/exacerbation, and 1-2 comorbidities: HTN, Obesity, Depression are also affecting patient's functional outcome.   REHAB POTENTIAL: Good  CLINICAL DECISION MAKING: Stable/uncomplicated  EVALUATION COMPLEXITY: Low   GOALS: Goals reviewed with patient? No  SHORT TERM GOALS: Target date: 10/12/2023  Patient will demonstrate undestanding of home exercise plan by performing exercises correctly with evidence of good carry over with min to no verbal or tactile cues .  Baseline: NT 10/03/23: Performing without verbal cues   Goal status: ACHIEVED    2.  Patient will show negative Ely's bilaterally as evidence of improvement in hip flexor flexibility to relieve pressure on low back forcing him into lumbar lordosis. Baseline: + Bilaterally Goal status: ONGOING     LONG TERM GOALS: Target date: 12/21/2023  Patient will improve modified Oswestry Disability Index (MODI) score by >=13 points as evidence of the minimal statistically significant change for improvement with low back pain disability and improvement in low back function (Copay et al, 2008) Baseline: 9/50 (18%) Goal  status: ONGOING    2.  Patient will reduce the number of locking episodes in low back to <=2 x per day as evidence of improve lumbar function, so that he can carry out day to day physical activities without being interrupted by pain and discomfort.  Baseline: 6-9x locking episodes per day   Goal status: ONGOING    3.  Patient will improve hip strength by 1/3 grade (I.E 4- to 4+) and abdominal strength by >= 1 grade for improved lumbar stability and to assist low back in absorbing mechanical forces in weight bearing positions for improved symptom response. Baseline: Hip Flex R/L 4/4, Hip Ext R/L 3-/3-, Hip Abd R/L 4/4, Sahrmann Grade Grade 4    Goal status: ONGOING     PLAN:  PT FREQUENCY: 1-2x/week  PT DURATION: 12 weeks  PLANNED INTERVENTIONS: 97164- PT Re-evaluation, 97750- Physical Performance Testing,  97110-Therapeutic exercises, 97530- Therapeutic activity, [id number]- Neuromuscular re-education, 97535- Self Care, 02859- Manual therapy, [id number]- Gait training, [phone]- Aquatic Therapy, H9716- Electrical stimulation (unattended), [phone]- Electrical stimulation (manual), [id number]- Ultrasound, 02987- Traction (mechanical), [id number]- Wound care (first 20 sq cm), 02401- Wound care (each additional 20 sq cm), 20560 (1-2 muscles), 20561 (3+ muscles)- Dry Needling, Patient/Family education, Balance training, Stair training, Taping, Joint mobilization, Joint manipulation, Spinal manipulation, Spinal mobilization, Vestibular training, DME instructions, Cryotherapy, and Moist heat.  PLAN FOR NEXT SESSION:  Continue to progress bird dogs with addition of ankle weights or dumbbells. Squats with upright trunk. Planks with longer holds. Palloff press.    Toribio Servant PT, DPT  [facility] [address] [place], [address] Phone: [phone]   Fax:  [phone]

## 2023-10-09 ENCOUNTER — Ambulatory Visit: Admitting: Physical Therapy

## 2023-10-09 ENCOUNTER — Telehealth: Payer: Self-pay | Admitting: Physical Therapy

## 2023-10-09 NOTE — Telephone Encounter (Signed)
 Called pt to inquire about absence from PT. Did not reach but left VM instructing pt to call back to reschedule and reminded him of his next apt.

## 2023-10-11 ENCOUNTER — Ambulatory Visit: Admitting: Physical Therapy

## 2023-10-18 ENCOUNTER — Telehealth: Payer: Self-pay | Admitting: Physical Therapy

## 2023-10-18 ENCOUNTER — Ambulatory Visit: Attending: Orthopedic Surgery | Admitting: Physical Therapy

## 2023-10-18 NOTE — Telephone Encounter (Signed)
 Pt called after missed call from PT after he missed apt. He explained that he meant to call to cancel tonight's apt and that he would not be able to come to his appointments next week. PT explained that any further missed appointments without a call would result in a discharge from his plan of care given the attendance policy.

## 2023-10-24 ENCOUNTER — Ambulatory Visit: Admitting: Physical Therapy

## 2023-10-26 ENCOUNTER — Encounter: Admitting: Physical Therapy

## 2023-10-31 ENCOUNTER — Encounter: Admitting: Physical Therapy

## 2023-11-02 ENCOUNTER — Encounter: Admitting: Physical Therapy

## 2023-11-07 ENCOUNTER — Encounter: Admitting: Physical Therapy

## 2023-11-09 ENCOUNTER — Encounter: Admitting: Physical Therapy

## 2023-11-13 ENCOUNTER — Encounter: Admitting: Physical Therapy

## 2023-11-15 ENCOUNTER — Encounter: Admitting: Physical Therapy

## 2023-11-20 ENCOUNTER — Encounter: Admitting: Physical Therapy

## 2023-11-22 ENCOUNTER — Encounter: Admitting: Physical Therapy

## 2023-11-27 ENCOUNTER — Encounter: Admitting: Physical Therapy

## 2023-11-29 ENCOUNTER — Encounter: Admitting: Physical Therapy

## 2023-12-04 ENCOUNTER — Encounter: Admitting: Physical Therapy

## 2023-12-06 ENCOUNTER — Encounter: Admitting: Physical Therapy

## 2024-03-17 ENCOUNTER — Other Ambulatory Visit: Payer: Self-pay

## 2024-03-17 ENCOUNTER — Emergency Department (HOSPITAL_BASED_OUTPATIENT_CLINIC_OR_DEPARTMENT_OTHER)
Admission: EM | Admit: 2024-03-17 | Discharge: 2024-03-17 | Disposition: A | Discharge status: Home / Self Care | Payer: Self-pay

## 2024-03-17 ENCOUNTER — Encounter (HOSPITAL_BASED_OUTPATIENT_CLINIC_OR_DEPARTMENT_OTHER): Payer: Self-pay

## 2024-03-17 DIAGNOSIS — M7918 Myalgia, other site: Secondary | ICD-10-CM

## 2024-03-17 DIAGNOSIS — M546 Pain in thoracic spine: Secondary | ICD-10-CM | POA: Diagnosis not present

## 2024-03-17 DIAGNOSIS — Y92410 Unspecified street and highway as the place of occurrence of the external cause: Secondary | ICD-10-CM | POA: Diagnosis not present

## 2024-03-17 DIAGNOSIS — M542 Cervicalgia: Secondary | ICD-10-CM | POA: Diagnosis present

## 2024-03-17 MED ORDER — IBUPROFEN 600 MG PO TABS: 600.0000 mg | ORAL_TABLET | Freq: Four times a day (QID) | ORAL | 0 refills | Status: AC | PRN

## 2024-03-17 MED ORDER — METHOCARBAMOL 750 MG PO TABS: 750.0000 mg | ORAL_TABLET | Freq: Two times a day (BID) | ORAL | 0 refills | Status: AC
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
## 2024-03-17 NOTE — Discharge Instructions (Signed)
 As we discussed, your injuries are felt to be musculoskeletal strain type injuries. Take the medications for comfort as directed. Follow up with your doctor if needed for recheck.
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
